# Patient Record
Sex: Male | Born: 1985 | Race: White | Hispanic: No | Marital: Married | Smoking: Never smoker
Health system: Southern US, Community
[De-identification: ages and names within clinical notes are randomized; demographics above are authoritative.]

## PROBLEM LIST (undated history)

## (undated) DIAGNOSIS — N200 Calculus of kidney: Secondary | ICD-10-CM

## (undated) DIAGNOSIS — R51 Headache: Secondary | ICD-10-CM

## (undated) DIAGNOSIS — R519 Headache, unspecified: Secondary | ICD-10-CM

## (undated) DIAGNOSIS — Z87442 Personal history of urinary calculi: Secondary | ICD-10-CM

## (undated) HISTORY — PX: WISDOM TOOTH EXTRACTION: SHX21

## (undated) HISTORY — DX: Calculus of kidney: N20.0

---

## 2010-08-04 ENCOUNTER — Emergency Department (HOSPITAL_COMMUNITY): Payer: 59

## 2010-08-04 ENCOUNTER — Emergency Department (HOSPITAL_COMMUNITY)
Admission: EM | Admit: 2010-08-04 | Discharge: 2010-08-04 | Disposition: A | Discharge status: Home / Self Care | Payer: 59 | Attending: Emergency Medicine | Admitting: Emergency Medicine

## 2010-08-04 DIAGNOSIS — N200 Calculus of kidney: Secondary | ICD-10-CM | POA: Insufficient documentation

## 2010-08-04 DIAGNOSIS — R1011 Right upper quadrant pain: Secondary | ICD-10-CM | POA: Insufficient documentation

## 2010-08-04 DIAGNOSIS — R10819 Abdominal tenderness, unspecified site: Secondary | ICD-10-CM | POA: Insufficient documentation

## 2010-08-04 LAB — POCT I-STAT, CHEM 8
Creatinine, Ser: 1 mg/dL (ref 0.4–1.5)
Glucose, Bld: 97 mg/dL (ref 70–99)
Hemoglobin: 17 g/dL (ref 13.0–17.0)
TCO2: 24 mmol/L (ref 0–100)

## 2010-08-04 LAB — URINALYSIS, ROUTINE W REFLEX MICROSCOPIC
Bilirubin Urine: NEGATIVE
Ketones, ur: 15 mg/dL — AB
Protein, ur: 100 mg/dL — AB
Urobilinogen, UA: 0.2 mg/dL (ref 0.0–1.0)

## 2015-06-12 ENCOUNTER — Encounter: Payer: Self-pay | Admitting: Unknown Physician Specialty

## 2015-06-12 ENCOUNTER — Ambulatory Visit (INDEPENDENT_AMBULATORY_CARE_PROVIDER_SITE_OTHER): Payer: Commercial Managed Care - PPO | Admitting: Unknown Physician Specialty

## 2015-06-12 VITALS — BP 124/64 | HR 70 | Temp 98.4°F | Ht 65.8 in | Wt 164.2 lb

## 2015-06-12 DIAGNOSIS — Z0000 Encounter for general adult medical examination without abnormal findings: Secondary | ICD-10-CM | POA: Diagnosis not present

## 2015-06-12 NOTE — Progress Notes (Signed)
   BP 124/64 mmHg  Pulse 70  Temp(Src) 98.4 F (36.9 C)  Ht 5' 5.8" (1.671 m)  Wt 164 lb 3.2 oz (74.481 kg)  BMI 26.67 kg/m2  SpO2 97%   Subjective:    Patient ID: Travis Wise, male    DOB: [birth year], 30 y.o.   MRN: [id number]  HPI: Travis Wise is a 30 y.o. male  Chief Complaint  Patient presents with  . Establish Care    Relevant past medical, surgical, family and social history reviewed and updated as indicated. Interim medical history since our last visit reviewed. Allergies and medications reviewed and updated.  Past Medical History  Diagnosis Date  . Kidney stones     History reviewed. No pertinent past surgical history.  History reviewed. No pertinent family history.   Review of Systems  Constitutional: Negative.   HENT: Negative.   Eyes: Negative.   Respiratory: Negative.   Cardiovascular: Negative.   Gastrointestinal: Negative.   Endocrine: Negative.   Genitourinary: Negative.   Skin: Negative.   Allergic/Immunologic: Negative.   Neurological: Negative.   Hematological: Negative.   Psychiatric/Behavioral: Negative.     Per HPI unless specifically indicated above     Objective:    BP 124/64 mmHg  Pulse 70  Temp(Src) 98.4 F (36.9 C)  Ht 5' 5.8" (1.671 m)  Wt 164 lb 3.2 oz (74.481 kg)  BMI 26.67 kg/m2  SpO2 97%  Wt Readings from Last 3 Encounters:  06/12/15 164 lb 3.2 oz (74.481 kg)    Physical Exam  Constitutional: He is oriented to person, place, and time. He appears well-developed and well-nourished.  HENT:  Head: Normocephalic.  Right Ear: Tympanic membrane, external ear and ear canal normal.  Left Ear: Tympanic membrane, external ear and ear canal normal.  Mouth/Throat: Uvula is midline, oropharynx is clear and moist and mucous membranes are normal.  Eyes: Pupils are equal, round, and reactive to light.  Cardiovascular: Normal rate, regular rhythm and normal heart sounds.  Exam reveals no gallop and no friction rub.   No  murmur heard. Pulmonary/Chest: Effort normal and breath sounds normal. No respiratory distress.  Abdominal: Soft. Bowel sounds are normal. He exhibits no distension. There is no tenderness.  Musculoskeletal: Normal range of motion.  Neurological: He is alert and oriented to person, place, and time. He has normal reflexes.  Skin: Skin is warm and dry.  Psychiatric: He has a normal mood and affect. His behavior is normal. Judgment and thought content normal.     Assessment & Plan:   Problem List Items Addressed This Visit    None    Visit Diagnoses    Annual physical exam    -  Primary    Relevant Orders    HIV antibody    CBC with Differential/Platelet    Comprehensive metabolic panel    Lipid Panel w/o Chol/HDL Ratio    TSH        Follow up plan: Return in about 1 year (around 06/11/2016).

## 2015-06-13 ENCOUNTER — Encounter: Payer: Self-pay | Admitting: Unknown Physician Specialty

## 2015-06-13 LAB — COMPREHENSIVE METABOLIC PANEL
ALBUMIN: 4.6 g/dL (ref 3.5–5.5)
ALT: 34 IU/L (ref 0–44)
AST: 24 IU/L (ref 0–40)
Albumin/Globulin Ratio: 1.6 (ref 1.1–2.5)
Alkaline Phosphatase: 116 IU/L (ref 39–117)
BUN / CREAT RATIO: 14 (ref 8–19)
BUN: 13 mg/dL (ref 6–20)
Bilirubin Total: 0.3 mg/dL (ref 0.0–1.2)
CALCIUM: 9.6 mg/dL (ref 8.7–10.2)
CO2: 22 mmol/L (ref 18–29)
CREATININE: 0.92 mg/dL (ref 0.76–1.27)
Chloride: 102 mmol/L (ref 96–106)
GFR calc Af Amer: 129 mL/min/{1.73_m2} (ref >59)
GFR, EST NON AFRICAN AMERICAN: 112 mL/min/{1.73_m2} (ref >59)
GLOBULIN, TOTAL: 2.8 g/dL (ref 1.5–4.5)
Glucose: 96 mg/dL (ref 65–99)
Potassium: 4.9 mmol/L (ref 3.5–5.2)
SODIUM: 141 mmol/L (ref 134–144)
Total Protein: 7.4 g/dL (ref 6.0–8.5)

## 2015-06-13 LAB — CBC WITH DIFFERENTIAL/PLATELET
BASOS: 1 %
Basophils Absolute: 0.1 10*3/uL (ref 0.0–0.2)
EOS (ABSOLUTE): 0.1 10*3/uL (ref 0.0–0.4)
EOS: 2 %
HEMATOCRIT: 46.5 % (ref 37.5–51.0)
HEMOGLOBIN: 15.9 g/dL (ref 12.6–17.7)
Immature Grans (Abs): 0 10*3/uL (ref 0.0–0.1)
Immature Granulocytes: 0 %
LYMPHS ABS: 2.5 10*3/uL (ref 0.7–3.1)
Lymphs: 37 %
MCH: 29 pg (ref 26.6–33.0)
MCHC: 34.2 g/dL (ref 31.5–35.7)
MCV: 85 fL (ref 79–97)
MONOCYTES: 10 %
Monocytes Absolute: 0.7 10*3/uL (ref 0.1–0.9)
Neutrophils Absolute: 3.4 10*3/uL (ref 1.4–7.0)
Neutrophils: 50 %
Platelets: 345 10*3/uL (ref 150–379)
RBC: 5.48 x10E6/uL (ref 4.14–5.80)
RDW: 13 % (ref 12.3–15.4)
WBC: 6.7 10*3/uL (ref 3.4–10.8)

## 2015-06-13 LAB — LIPID PANEL W/O CHOL/HDL RATIO
Cholesterol, Total: 189 mg/dL (ref 100–199)
HDL: 40 mg/dL (ref >39)
LDL CALC: 117 mg/dL — AB (ref 0–99)
Triglycerides: 159 mg/dL — ABNORMAL HIGH (ref 0–149)
VLDL Cholesterol Cal: 32 mg/dL (ref 5–40)

## 2015-06-13 LAB — TSH: TSH: 2.11 u[IU]/mL (ref 0.450–4.500)

## 2015-06-13 LAB — HIV ANTIBODY (ROUTINE TESTING W REFLEX): HIV Screen 4th Generation wRfx: NONREACTIVE

## 2015-06-13 NOTE — Progress Notes (Signed)
Quick Note:  Normal labs. Patient notified by letter. ______ 

## 2016-03-21 ENCOUNTER — Ambulatory Visit (INDEPENDENT_AMBULATORY_CARE_PROVIDER_SITE_OTHER): Payer: Commercial Managed Care - PPO | Admitting: Family Medicine

## 2016-03-21 ENCOUNTER — Encounter: Payer: Self-pay | Admitting: Family Medicine

## 2016-03-21 VITALS — BP 129/82 | HR 82 | Temp 98.0°F | Wt 169.0 lb

## 2016-03-21 DIAGNOSIS — J101 Influenza due to other identified influenza virus with other respiratory manifestations: Secondary | ICD-10-CM

## 2016-03-21 MED ORDER — OSELTAMIVIR PHOSPHATE 75 MG PO CAPS: 75.0000 mg | ORAL_CAPSULE | Freq: Two times a day (BID) | ORAL | 0 refills | Status: DC

## 2016-03-21 MED ORDER — BENZONATATE 100 MG PO CAPS: 200.0000 mg | ORAL_CAPSULE | Freq: Three times a day (TID) | ORAL | 0 refills | Status: DC | PRN

## 2016-03-21 MED ORDER — HYDROCOD POLST-CPM POLST ER 10-8 MG/5ML PO SUER: 5.0000 mL | Freq: Two times a day (BID) | ORAL | 0 refills | Status: DC | PRN

## 2016-03-21 NOTE — Progress Notes (Signed)
   BP 129/82   Pulse 82   Temp 98 F (36.7 C)   Wt 169 lb (76.7 kg)   SpO2 98%   BMI 27.44 kg/m    Subjective:    Patient ID: Travis Wise, male    DOB: 1986-04-05, 30 y.o.   MRN: [id number]  HPI: Travis Wise is a 30 y.o. male  Chief Complaint  Patient presents with  . URI    x 1 day, head and chest congestion, cough, sore throat. No fever. No body aches.    Patient presents with 1 day of congestion, productive cough, sore throat. Denies fever, chills, aches, N/V/D, SOB. Wife started with symptoms about 12 hours earlier. States he wouldn't have come in, but has a 326 week old daughter at home and does not want to give anything to her. Not currently taking anything OTC other than cough drops.   Relevant past medical, surgical, family and social history reviewed and updated as indicated. Interim medical history since our last visit reviewed. Allergies and medications reviewed and updated.  Review of Systems  Constitutional: Negative.   HENT: Positive for congestion and sore throat.   Eyes: Negative.   Respiratory: Positive for cough.   Cardiovascular: Negative.   Gastrointestinal: Negative.   Genitourinary: Negative.   Musculoskeletal: Negative.   Neurological: Negative.   Psychiatric/Behavioral: Negative.    Per HPI unless specifically indicated above     Objective:    BP 129/82   Pulse 82   Temp 98 F (36.7 C)   Wt 169 lb (76.7 kg)   SpO2 98%   BMI 27.44 kg/m   Wt Readings from Last 3 Encounters:  03/21/16 169 lb (76.7 kg)  06/12/15 164 lb 3.2 oz (74.5 kg)    Physical Exam  Constitutional: He is oriented to person, place, and time. He appears well-developed and well-nourished. No distress.  HENT:  Head: Atraumatic.  Right Ear: External ear normal.  Left Ear: External ear normal.  Nose: Nose normal.  Oropharynx erythematous  Eyes: Conjunctivae are normal. Pupils are equal, round, and reactive to light. No scleral icterus.  Neck: Normal range of motion.  Neck supple.  Cardiovascular: Normal rate and normal heart sounds.   Pulmonary/Chest: Effort normal and breath sounds normal. No respiratory distress. He has no wheezes.  Musculoskeletal: Normal range of motion.  Lymphadenopathy:    He has no cervical adenopathy.  Neurological: He is alert and oriented to person, place, and time.  Skin: Skin is warm and dry.  Psychiatric: He has a normal mood and affect. His behavior is normal.  Nursing note and vitals reviewed.     Assessment & Plan:   Problem List Items Addressed This Visit    None    Visit Diagnoses    Influenza B    -  Primary   Given wife's positive flu test and 6 week infant at home, will send tamiflu. Will also send tessalon and tussionex for supportive care   Relevant Medications   oseltamivir (TAMIFLU) 75 MG capsule    Discussed at length precautions, especially sedation, with the tussionex given having a newborn at home. Patient voiced understanding.    Follow up plan: Return if symptoms worsen or fail to improve.

## 2016-03-21 NOTE — Patient Instructions (Signed)
Follow up as needed

## 2016-07-16 ENCOUNTER — Ambulatory Visit (INDEPENDENT_AMBULATORY_CARE_PROVIDER_SITE_OTHER): Payer: Commercial Managed Care - PPO | Admitting: Family Medicine

## 2016-07-16 ENCOUNTER — Encounter: Payer: Self-pay | Admitting: Family Medicine

## 2016-07-16 VITALS — BP 138/81 | HR 85 | Temp 98.0°F | Ht 65.75 in | Wt 166.5 lb

## 2016-07-16 DIAGNOSIS — Z91018 Allergy to other foods: Secondary | ICD-10-CM

## 2016-07-16 DIAGNOSIS — G43909 Migraine, unspecified, not intractable, without status migrainosus: Secondary | ICD-10-CM | POA: Insufficient documentation

## 2016-07-16 DIAGNOSIS — Z13220 Encounter for screening for lipoid disorders: Secondary | ICD-10-CM | POA: Diagnosis not present

## 2016-07-16 DIAGNOSIS — R0602 Shortness of breath: Secondary | ICD-10-CM

## 2016-07-16 DIAGNOSIS — J45909 Unspecified asthma, uncomplicated: Secondary | ICD-10-CM | POA: Insufficient documentation

## 2016-07-16 DIAGNOSIS — J4520 Mild intermittent asthma, uncomplicated: Secondary | ICD-10-CM | POA: Diagnosis not present

## 2016-07-16 DIAGNOSIS — Z1329 Encounter for screening for other suspected endocrine disorder: Secondary | ICD-10-CM

## 2016-07-16 DIAGNOSIS — Z0000 Encounter for general adult medical examination without abnormal findings: Secondary | ICD-10-CM | POA: Diagnosis not present

## 2016-07-16 DIAGNOSIS — G43009 Migraine without aura, not intractable, without status migrainosus: Secondary | ICD-10-CM

## 2016-07-16 LAB — MICROSCOPIC EXAMINATION
Bacteria, UA: NONE SEEN
RBC MICROSCOPIC, UA: NONE SEEN /HPF (ref 0–?)
WBC UA: NONE SEEN /HPF (ref 0–?)

## 2016-07-16 LAB — UA/M W/RFLX CULTURE, ROUTINE
Bilirubin, UA: NEGATIVE
GLUCOSE, UA: NEGATIVE
KETONES UA: NEGATIVE
Leukocytes, UA: NEGATIVE
NITRITE UA: NEGATIVE
Protein, UA: NEGATIVE
RBC, UA: NEGATIVE
SPEC GRAV UA: 1.025 (ref 1.005–1.030)
UUROB: 0.2 mg/dL (ref 0.2–1.0)
pH, UA: 7 (ref 5.0–7.5)

## 2016-07-16 MED ORDER — SUMATRIPTAN SUCCINATE 50 MG PO TABS: 50.0000 mg | ORAL_TABLET | ORAL | 3 refills | Status: DC | PRN

## 2016-07-16 MED ORDER — ALBUTEROL SULFATE HFA 108 (90 BASE) MCG/ACT IN AERS: 2.0000 | INHALATION_SPRAY | Freq: Four times a day (QID) | RESPIRATORY_TRACT | 11 refills | Status: DC | PRN

## 2016-07-16 MED ORDER — CETIRIZINE HCL 10 MG PO TABS: 10.0000 mg | ORAL_TABLET | Freq: Every day | ORAL | 11 refills | Status: DC

## 2016-07-16 NOTE — Assessment & Plan Note (Signed)
Spirometry today showing some obstructive disease. Discussed proper use of albuterol inhaler and importance of good allergy regimen given consistent outdoor work. Will start zyrtec daily. Follow up if no improvement.

## 2016-07-16 NOTE — Progress Notes (Signed)
BP 138/81 (BP Location: Right Arm, Patient Position: Sitting, Cuff Size: Normal)   Pulse 85   Temp 98 F (36.7 C)   Ht 5' 5.75" (1.67 m)   Wt 166 lb 8 oz (75.5 kg)   SpO2 98%   BMI 27.08 kg/m    Subjective:    Patient ID: Travis Wise, male    DOB: March 17, 1986, 31 y.o.   MRN: [id number]  HPI: Travis Wise is a 31 y.o. male presenting on 07/16/2016 for comprehensive medical examination. Current medical complaints include:listed below  Has alpha gal allergy s/p tick bite about 3 years ago. Can not tolerate red meat or dairy, but now able to tolerate pork ok. Hives, vomiting, diarrhea if consuming any dairy or red meat or anything cooked with it. Would like to be rechecked today since his pork tolerance has improved and he has not tried red meat in years so does not know if that has improved.   Long hx of migraines, but has noticed them worsening in severity and frequency the past 3-4 months. Taking excedrin migraine with fair relief - now having several migraines weekly. Wears sunglasses every day while working outside. States was able to go to a dark room and wait them out when working indoors but now since switching to outdoor work they have flared up.   SOB since starting new job working outside all day every day the past 3-4 months. Mother has asthma, wondering if he may have some too. Never noticed an issue until recently. Denies wheezing or coughing spells, but feels some chest tightness and SOB with exertion. Has not tried taking anything for sxs. No known seasonal allergies.   He currently lives with: wife and infant Interim Problems from his last visit: no  Depression Screen done today and results listed below:  Depression screen [facility] 2/9 07/16/2016  Decreased Interest 0  Down, Depressed, Hopeless 0  PHQ - 2 Score 0  Altered sleeping 0  Tired, decreased energy 0  Change in appetite 0  Feeling bad or failure about yourself  0  Trouble concentrating 0  Moving slowly or  fidgety/restless 0  Suicidal thoughts 0  PHQ-9 Score 0    The patient does not have a history of falls. I did not complete a risk assessment for falls. A plan of care for falls was not documented.   Past Medical History:  Past Medical History:  Diagnosis Date  . Kidney stones     Surgical History:  No past surgical history on file.  Medications:  No current outpatient prescriptions on file prior to visit.   No current facility-administered medications on file prior to visit.     Allergies:  Allergies  Allergen Reactions  . Other Hives    Red Meat    Social History:  Social History   Social History  . Marital status: Married    Spouse name: N/A  . Number of children: N/A  . Years of education: N/A   Occupational History  . Not on file.   Social History Main Topics  . Smoking status: Never Smoker  . Smokeless tobacco: Former Neurosurgeon  . Alcohol use 0.0 oz/week     Comment: on occasion  . Drug use: No  . Sexual activity: Yes   Other Topics Concern  . Not on file   Social History Narrative  . No narrative on file   History  Smoking Status  . Never Smoker  Smokeless Tobacco  . Former Neurosurgeon  History  Alcohol Use  . 0.0 oz/week    Comment: on occasion    Family History:  No family history on file.  Past medical history, surgical history, medications, allergies, family history and social history reviewed with patient today and changes made to appropriate areas of the chart.   Review of Systems - General ROS: negative Psychological ROS: negative Ophthalmic ROS: negative ENT ROS: negative Respiratory ROS: positive for - shortness of breath Cardiovascular ROS: no chest pain or dyspnea on exertion Gastrointestinal ROS: no abdominal pain, change in bowel habits, or black or bloody stools Genito-Urinary ROS: no dysuria, trouble voiding, or hematuria Musculoskeletal ROS: negative Neurological ROS: no TIA or stroke symptoms positive for -  headaches Dermatological ROS: negative All other ROS negative except what is listed above and in the HPI.      Objective:    BP 138/81 (BP Location: Right Arm, Patient Position: Sitting, Cuff Size: Normal)   Pulse 85   Temp 98 F (36.7 C)   Ht 5' 5.75" (1.67 m)   Wt 166 lb 8 oz (75.5 kg)   SpO2 98%   BMI 27.08 kg/m   Wt Readings from Last 3 Encounters:  07/16/16 166 lb 8 oz (75.5 kg)  03/21/16 169 lb (76.7 kg)  06/12/15 164 lb 3.2 oz (74.5 kg)    Physical Exam  Constitutional: He is oriented to person, place, and time. He appears well-developed and well-nourished. No distress.  HENT:  Head: Atraumatic.  Right Ear: External ear normal.  Left Ear: External ear normal.  Nose: Nose normal.  Mouth/Throat: Oropharynx is clear and moist. No oropharyngeal exudate.  Eyes: Conjunctivae are normal. Pupils are equal, round, and reactive to light. No scleral icterus.  Neck: Normal range of motion. Neck supple. No thyromegaly present.  Cardiovascular: Normal rate and normal heart sounds.   Pulmonary/Chest: Effort normal and breath sounds normal. No respiratory distress.  Abdominal: Soft. Bowel sounds are normal. He exhibits no distension and no mass. There is no tenderness.  Musculoskeletal: Normal range of motion.  Lymphadenopathy:    He has no cervical adenopathy.  Neurological: He is alert and oriented to person, place, and time. No cranial nerve deficit. He exhibits normal muscle tone. Coordination normal.  Skin: Skin is warm and dry. No rash noted.  Psychiatric: He has a normal mood and affect. His behavior is normal.  Nursing note and vitals reviewed.     Assessment & Plan:   Problem List Items Addressed This Visit      Cardiovascular and Mediastinum   Migraine    Continue excedrin and/or tylenol OTC prn, start taking imitrex as soon as starting to feel migraine come on. Can repeat once in 2 hours if persisting. Keep headache diary to see if triggers can be pinpointed.  Continue wearing sunglasses and hats when working outside to help avoid harsh sunlight.       Relevant Medications   SUMAtriptan (IMITREX) 50 MG tablet     Respiratory   Asthma    Spirometry today showing some obstructive disease. Discussed proper use of albuterol inhaler and importance of good allergy regimen given consistent outdoor work. Will start zyrtec daily. Follow up if no improvement.       Relevant Medications   albuterol (PROVENTIL HFA;VENTOLIN HFA) 108 (90 Base) MCG/ACT inhaler    Other Visit Diagnoses    Annual physical exam    -  Primary   Not fasting for labs today. Await results   Relevant Orders  CBC with Differential/Platelet (Completed)   Comprehensive metabolic panel (Completed)   Lipid panel (Completed)   TSH (Completed)   UA/M w/rflx Culture, Routine (Completed)   Screening cholesterol level       Relevant Orders   Lipid panel (Completed)   Thyroid disorder screen       Relevant Orders   TSH (Completed)   Allergy to meat       Await Alpha gal levels. Continue avoidance of red meat and dairy in meantime   Relevant Orders   Alpha Gal IgE (Completed)   SOB (shortness of breath)       PFTs today pre and post nebulizer indicate some mild asthma. See treatment plan under asthma   Relevant Orders   Spirometry with Graph (Completed)       Discussed aspirin prophylaxis for myocardial infarction prevention and decision was it was not indicated  LABORATORY TESTING:  Health maintenance labs ordered today as discussed above.   The natural history of prostate cancer and ongoing controversy regarding screening and potential treatment outcomes of prostate cancer has been discussed with the patient. The meaning of a false positive PSA and a false negative PSA has been discussed. He indicates understanding of the limitations of this screening test and wishes not to proceed with screening PSA testing.   IMMUNIZATIONS:   - Tdap: Tetanus vaccination status reviewed:  last tetanus booster within 10 years. - Influenza: Up to date  SCREENING: -Spirometry: Ordered today   PATIENT COUNSELING:    Sexuality: Discussed sexually transmitted diseases, partner selection, use of condoms, avoidance of unintended pregnancy  and contraceptive alternatives.   Advised to avoid cigarette smoking.  I discussed with the patient that most people either abstain from alcohol or drink within safe limits (<=14/week and <=4 drinks/occasion for males, <=7/weeks and <= 3 drinks/occasion for females) and that the risk for alcohol disorders and other health effects rises proportionally with the number of drinks per week and how often a drinker exceeds daily limits.  Discussed cessation/primary prevention of drug use and availability of treatment for abuse.   Diet: Encouraged to adjust caloric intake to maintain  or achieve ideal body weight, to reduce intake of dietary saturated fat and total fat, to limit sodium intake by avoiding high sodium foods and not adding table salt, and to maintain adequate dietary potassium and calcium preferably from fresh fruits, vegetables, and low-fat dairy products.    stressed the importance of regular exercise  Injury prevention: Discussed safety belts, safety helmets, smoke detector, smoking near bedding or upholstery.   Dental health: Discussed importance of regular tooth brushing, flossing, and dental visits.   Follow up plan: NEXT PREVENTATIVE PHYSICAL DUE IN 1 YEAR. Return in about 1 year (around 07/16/2017) for Annual Exam.

## 2016-07-16 NOTE — Assessment & Plan Note (Signed)
Continue excedrin and/or tylenol OTC prn, start taking imitrex as soon as starting to feel migraine come on. Can repeat once in 2 hours if persisting. Keep headache diary to see if triggers can be pinpointed. Continue wearing sunglasses and hats when working outside to help avoid harsh sunlight.

## 2016-07-19 ENCOUNTER — Telehealth: Payer: Self-pay | Admitting: Family Medicine

## 2016-07-19 LAB — COMPREHENSIVE METABOLIC PANEL
ALBUMIN: 4.5 g/dL (ref 3.5–5.5)
ALT: 32 IU/L (ref 0–44)
AST: 19 IU/L (ref 0–40)
Albumin/Globulin Ratio: 2.1 (ref 1.2–2.2)
Alkaline Phosphatase: 99 IU/L (ref 39–117)
BUN/Creatinine Ratio: 17 (ref 9–20)
BUN: 16 mg/dL (ref 6–20)
Bilirubin Total: <0.2 mg/dL (ref 0.0–1.2)
CALCIUM: 9.3 mg/dL (ref 8.7–10.2)
CO2: 21 mmol/L (ref 18–29)
Chloride: 104 mmol/L (ref 96–106)
Creatinine, Ser: 0.94 mg/dL (ref 0.76–1.27)
GFR calc Af Amer: 124 mL/min/{1.73_m2} (ref >59)
GFR, EST NON AFRICAN AMERICAN: 108 mL/min/{1.73_m2} (ref >59)
GLUCOSE: 100 mg/dL — AB (ref 65–99)
Globulin, Total: 2.1 g/dL (ref 1.5–4.5)
Potassium: 4.2 mmol/L (ref 3.5–5.2)
Sodium: 143 mmol/L (ref 134–144)
Total Protein: 6.6 g/dL (ref 6.0–8.5)

## 2016-07-19 LAB — CBC WITH DIFFERENTIAL/PLATELET
BASOS ABS: 0.1 10*3/uL (ref 0.0–0.2)
Basos: 1 %
EOS (ABSOLUTE): 0.1 10*3/uL (ref 0.0–0.4)
EOS: 1 %
HEMOGLOBIN: 15.2 g/dL (ref 13.0–17.7)
Hematocrit: 44.9 % (ref 37.5–51.0)
IMMATURE GRANULOCYTES: 1 %
Immature Grans (Abs): 0.1 10*3/uL (ref 0.0–0.1)
LYMPHS: 43 %
Lymphocytes Absolute: 3.2 10*3/uL — ABNORMAL HIGH (ref 0.7–3.1)
MCH: 29.2 pg (ref 26.6–33.0)
MCHC: 33.9 g/dL (ref 31.5–35.7)
MCV: 86 fL (ref 79–97)
MONOCYTES: 10 %
Monocytes Absolute: 0.7 10*3/uL (ref 0.1–0.9)
NEUTROS PCT: 44 %
Neutrophils Absolute: 3.4 10*3/uL (ref 1.4–7.0)
Platelets: 287 10*3/uL (ref 150–379)
RBC: 5.2 x10E6/uL (ref 4.14–5.80)
RDW: 13.8 % (ref 12.3–15.4)
WBC: 7.5 10*3/uL (ref 3.4–10.8)

## 2016-07-19 LAB — LIPID PANEL
CHOLESTEROL TOTAL: 185 mg/dL (ref 100–199)
Chol/HDL Ratio: 6 ratio — ABNORMAL HIGH (ref 0.0–5.0)
HDL: 31 mg/dL — ABNORMAL LOW (ref >39)
LDL CALC: 97 mg/dL (ref 0–99)
TRIGLYCERIDES: 284 mg/dL — AB (ref 0–149)
VLDL CHOLESTEROL CAL: 57 mg/dL — AB (ref 5–40)

## 2016-07-19 LAB — TSH: TSH: 2.24 u[IU]/mL (ref 0.450–4.500)

## 2016-07-19 LAB — ALPHA GAL IGE: Alpha Gal IgE*: <0.1 kU/L (ref <0.35)

## 2016-07-19 NOTE — Telephone Encounter (Signed)
Please call pt and let him know that all of his labs came back normal, including his alpha gal testing. If he would like to get a referral to the allergy specialist, I'm happy to do so to figure out why he is allergic to meat and dairy

## 2016-07-19 NOTE — Telephone Encounter (Signed)
Patient notified of the results. He is going to try eating meat and see how he does. If he has symptoms still, he will let us know and will order the referral.

## 2016-08-20 ENCOUNTER — Ambulatory Visit (INDEPENDENT_AMBULATORY_CARE_PROVIDER_SITE_OTHER): Payer: Commercial Managed Care - PPO | Admitting: Family Medicine

## 2016-08-20 ENCOUNTER — Telehealth: Payer: Self-pay | Admitting: Family Medicine

## 2016-08-20 ENCOUNTER — Ambulatory Visit
Admission: RE | Admit: 2016-08-20 | Discharge: 2016-08-20 | Disposition: A | Discharge status: Home / Self Care | Payer: Commercial Managed Care - PPO | Source: Ambulatory Visit | Attending: Family Medicine | Admitting: Family Medicine

## 2016-08-20 ENCOUNTER — Encounter: Payer: Self-pay | Admitting: Family Medicine

## 2016-08-20 VITALS — BP 120/81 | HR 62 | Temp 98.5°F | Wt 163.0 lb

## 2016-08-20 DIAGNOSIS — G43009 Migraine without aura, not intractable, without status migrainosus: Secondary | ICD-10-CM | POA: Diagnosis not present

## 2016-08-20 DIAGNOSIS — M25511 Pain in right shoulder: Secondary | ICD-10-CM | POA: Insufficient documentation

## 2016-08-20 MED ORDER — CYCLOBENZAPRINE HCL 10 MG PO TABS: 10.0000 mg | ORAL_TABLET | Freq: Three times a day (TID) | ORAL | 0 refills | Status: DC | PRN

## 2016-08-20 MED ORDER — TOPIRAMATE 25 MG PO TABS: ORAL_TABLET | ORAL | 1 refills | Status: DC

## 2016-08-20 MED ORDER — OXYCODONE-ACETAMINOPHEN 5-325 MG PO TABS: 1.0000 | ORAL_TABLET | Freq: Three times a day (TID) | ORAL | 0 refills | Status: DC | PRN

## 2016-08-20 MED ORDER — PREDNISONE 50 MG PO TABS: 50.0000 mg | ORAL_TABLET | Freq: Every day | ORAL | 0 refills | Status: DC

## 2016-08-20 NOTE — Patient Instructions (Signed)
Follow up as needed

## 2016-08-20 NOTE — Telephone Encounter (Signed)
Patient notified of results.

## 2016-08-20 NOTE — Telephone Encounter (Signed)
Please call pt and let him know his x-ray was normal, as expected. Continue as planned

## 2016-08-20 NOTE — Progress Notes (Signed)
BP 120/81   Pulse 62   Temp 98.5 F (36.9 C)   Wt 163 lb (73.9 kg)   SpO2 100%   BMI 26.51 kg/m    Subjective:    Patient ID: Travis Wise, male    DOB: September 14, 1985, 31 y.o.   MRN: [id number]  HPI: Travis ClicheCharles Pettus is a 31 y.o. male presenting today with right shoulder pain resulting from an injury that occurred 1 week ago while throwing a baseball. There was no audible pop or tear, but there was immediate 7/10 pain. The pain radiates down to his elbow and has not improved over the last week. He endorses weakness but denies numbness and tingling. The pain is currently a 4/10, but becomes more severe throughout the day and with arm movement. Has tried tylenol, ice, heat, biofreeze, TENS unit therapy, and took 2 vicodin one night when he woke up to terrible pain which did not help. Has injured this shoulder 2 times before in this manner but says it improved in a matter of days, was not seen by ortho.   Pt also wanting to discuss some issues he had when taking the imitrex given last time for migraine abortion. Both times he tried taking it his arms went completely numb so he has d/c'd the medication. Wanting to know what else can be taken for his migraines.   Chief Complaint  Patient presents with  . Shoulder Pain    right shoulder, injured approx 1 week ago playing softball. Most of the pain is in his shoulder, but some pain goes down to his elbow. Pain is getting worse. Nothing brings relief.    Past Medical History:  Diagnosis Date  . Kidney stones    Social History   Social History  . Marital status: Married    Spouse name: N/A  . Number of children: N/A  . Years of education: N/A   Occupational History  . Not on file.   Social History Main Topics  . Smoking status: Never Smoker  . Smokeless tobacco: Former NeurosurgeonUser  . Alcohol use 0.0 oz/week     Comment: on occasion  . Drug use: No  . Sexual activity: Yes   Other Topics Concern  . Not on file   Social History Narrative   . No narrative on file   Relevant past medical, surgical, family and social history reviewed and updated as indicated. Interim medical history since our last visit reviewed. Allergies and medications reviewed and updated.  Review of Systems  HENT: Negative.   Eyes: Negative.   Respiratory: Negative.   Cardiovascular: Negative.   Gastrointestinal: Negative.   Endocrine: Negative.   Genitourinary: Negative.   Musculoskeletal: Positive for arthralgias (shoulder pain ). Negative for joint swelling.  Skin: Negative.   Allergic/Immunologic: Negative.   Neurological: Positive for weakness, numbness and headaches.  Hematological: Negative.   Psychiatric/Behavioral: Negative.     Per HPI unless specifically indicated above     Objective:    BP 120/81   Pulse 62   Temp 98.5 F (36.9 C)   Wt 163 lb (73.9 kg)   SpO2 100%   BMI 26.51 kg/m   Wt Readings from Last 3 Encounters:  08/20/16 163 lb (73.9 kg)  07/16/16 166 lb 8 oz (75.5 kg)  03/21/16 169 lb (76.7 kg)    Physical Exam  Constitutional: He is oriented to person, place, and time. He appears well-developed and well-nourished. No distress.  HENT:  Head: Normocephalic and atraumatic.  Eyes: Conjunctivae  are normal. Right eye exhibits no discharge. Left eye exhibits no discharge. No scleral icterus.  Neck: Normal range of motion. Neck supple.  Cardiovascular: Normal rate.   Pulmonary/Chest: Effort normal. No respiratory distress.  Musculoskeletal: He exhibits tenderness. He exhibits no edema or deformity.  Generalized TTP over right shoulder, no swelling, mild bruising at upper border of the scapula; Decreased ROM through right shoulder with positive arm raise, arm able to be raised passively with moderate pain; 4/5 grip strength on right side Full ROM and 5/5 strength of right elbow; Left shoulder and elbow are full ROM and 5/5   Neurological: He is alert and oriented to person, place, and time. Coordination normal.  Skin:  Skin is warm and dry. He is not diaphoretic. No erythema.  Psychiatric: He has a normal mood and affect.      Assessment & Plan:   Problem List Items Addressed This Visit      Cardiovascular and Mediastinum   Migraine    D/c imitrex, will try topamax 25 mg QD x 1 week then BID. Risks and benefits discussed. F/u in 6 weeks for recheck      Relevant Medications   oxyCODONE-acetaminophen (ROXICET) 5-325 MG tablet   cyclobenzaprine (FLEXERIL) 10 MG tablet   topiramate (TOPAMAX) 25 MG tablet    Other Visit Diagnoses    Acute pain of right shoulder    -  Primary   Continue home remedies, will obtain x-ray and place orthopedic referral in case no relief with prednisone, flexeril, and percocet for overnights.    Relevant Orders   AMB referral to orthopedics   DG Shoulder Right (Completed)    Precautions discussed with the medications and sedation risks. No alcohol, driving, etc.    Follow up plan: Return in about 6 weeks (around 10/01/2016) for HAs.

## 2016-08-21 NOTE — Assessment & Plan Note (Addendum)
D/c imitrex, will try topamax 25 mg QD x 1 week then BID. Risks and benefits discussed. F/u in 6 weeks for recheck

## 2016-09-06 ENCOUNTER — Other Ambulatory Visit: Payer: Self-pay | Admitting: Orthopedic Surgery

## 2016-09-16 ENCOUNTER — Other Ambulatory Visit: Payer: Self-pay

## 2016-09-16 MED ORDER — TOPIRAMATE 25 MG PO TABS: 25.0000 mg | ORAL_TABLET | Freq: Two times a day (BID) | ORAL | 1 refills | Status: DC

## 2016-09-25 ENCOUNTER — Telehealth: Payer: Self-pay

## 2016-09-25 NOTE — Telephone Encounter (Signed)
Pharmacy sent refill request for a 90 day supply on Topiramate 25mg . Someone took it out of his med list on 09/23/16.

## 2016-09-25 NOTE — Telephone Encounter (Signed)
Please call pt and verify what he is or isn't taking as it appears someone d/c'd all of his medications

## 2016-09-25 NOTE — Telephone Encounter (Signed)
Left message to call.

## 2016-09-26 ENCOUNTER — Encounter
Admission: RE | Admit: 2016-09-26 | Discharge: 2016-09-26 | Disposition: A | Discharge status: Home / Self Care | Payer: Commercial Managed Care - PPO | Source: Ambulatory Visit | Attending: Orthopedic Surgery | Admitting: Orthopedic Surgery

## 2016-09-26 HISTORY — DX: Headache, unspecified: R51.9

## 2016-09-26 HISTORY — DX: Personal history of urinary calculi: Z87.442

## 2016-09-26 HISTORY — DX: Headache: R51

## 2016-09-26 NOTE — Patient Instructions (Signed)
  Your procedure is scheduled on: 10/03/16 Report to Same Day Surgery 2nd floor medical mall (Medical Mall Entrance-take elevator on left to 2nd floor.  Check in with surgery information desk.) To find out your arrival time please call [phone] between 1PM - 3PM on 10/02/16  Remember: Instructions that are not followed completely may result in serious medical risk, up to and including death, or upon the discretion of your surgeon and anesthesiologist your surgery may need to be rescheduled.    _x___ 1. Do not eat food or drink liquids after midnight. No gum chewing or                              hard candies.     __x__ 2. No Alcohol for 24 hours before or after surgery.   __x__3. No Smoking for 24 prior to surgery.   ____  4. Bring all medications with you on the day of surgery if instructed.    __x__ 5. Notify your doctor if there is any change in your medical condition     (cold, fever, infections).     Do not wear jewelry, make-up, hairpins, clips or nail polish.  Do not wear lotions, powders, or perfumes. You may wear deodorant.  Do not shave 48 hours prior to surgery. Men may shave face and neck.  Do not bring valuables to the hospital.    [facility] is not responsible for any belongings or valuables.               Contacts, dentures or bridgework may not be worn into surgery.  Leave your suitcase in the car. After surgery it may be brought to your room.  For patients admitted to the hospital, discharge time is determined by your                       treatment team.   Patients discharged the day of surgery will not be allowed to drive home.  You will need someone to drive you home and stay with you the night of your procedure.    Please read over the following fact sheets that you were given:   [facility] and or MRSA Information   ____ Take anti-hypertensive (unless it includes a diuretic), cardiac, seizure, asthma,     anti-reflux and psychiatric  medicines. These include:  1.   2.  3.  4.  5.  6.  ____Fleets enema or Magnesium Citrate as directed.   _x___ Use CHG Soap or sage wipes as directed on instruction sheet   ____ Use inhalers on the day of surgery and bring to hospital day of surgery  ____ Stop Metformin and Janumet 2 days prior to surgery.    ____ Take 1/2 of usual insulin dose the night before surgery and none on the morning     surgery.   ____ Follow recommendations from Cardiologist, Pulmonologist or PCP regarding  stopping Aspirin, Coumadin, Pllavix ,Eliquis, Effient, or Pradaxa, and Pletal.  ____Stop Anti-inflammatories such as Advil, Aleve, Ibuprofen, Motrin, Naproxen, Naprosyn, Goodies powders or aspirin products. OK to take Tylenol and  Celebrex.   ____ Stop supplements until after surgery.  But may continue Vitamin D, Vitamin B,       and multivitamin.   ____ Bring C-Pap to the hospital.    

## 2016-09-26 NOTE — Telephone Encounter (Signed)
I spoke with patient, he said that wasn't supposed to be removed from his med list. But he states he does not need a refill on it at this time.

## 2016-09-27 ENCOUNTER — Ambulatory Visit: Payer: Commercial Managed Care - PPO | Admitting: Family Medicine

## 2016-09-27 ENCOUNTER — Inpatient Hospital Stay: Admission: RE | Admit: 2016-09-27 | Payer: Commercial Managed Care - PPO | Source: Ambulatory Visit

## 2016-10-01 ENCOUNTER — Encounter
Admission: RE | Admit: 2016-10-01 | Discharge: 2016-10-01 | Disposition: A | Discharge status: Home / Self Care | Payer: Commercial Managed Care - PPO | Source: Ambulatory Visit | Attending: Orthopedic Surgery | Admitting: Orthopedic Surgery

## 2016-10-01 DIAGNOSIS — R51 Headache: Secondary | ICD-10-CM | POA: Insufficient documentation

## 2016-10-01 DIAGNOSIS — S43431A Superior glenoid labrum lesion of right shoulder, initial encounter: Secondary | ICD-10-CM | POA: Insufficient documentation

## 2016-10-01 DIAGNOSIS — M7550 Bursitis of unspecified shoulder: Secondary | ICD-10-CM | POA: Diagnosis not present

## 2016-10-01 LAB — CBC WITH DIFFERENTIAL/PLATELET
BASOS PCT: 1 %
Basophils Absolute: 0.1 10*3/uL (ref 0–0.1)
EOS ABS: 0.1 10*3/uL (ref 0–0.7)
Eosinophils Relative: 2 %
HCT: 48.5 % (ref 40.0–52.0)
HEMOGLOBIN: 15.9 g/dL (ref 13.0–18.0)
Lymphocytes Relative: 37 %
Lymphs Abs: 3 10*3/uL (ref 1.0–3.6)
MCH: 28.8 pg (ref 26.0–34.0)
MCHC: 32.8 g/dL (ref 32.0–36.0)
MCV: 87.7 fL (ref 80.0–100.0)
MONOS PCT: 9 %
Monocytes Absolute: 0.7 10*3/uL (ref 0.2–1.0)
NEUTROS PCT: 51 %
Neutro Abs: 4.3 10*3/uL (ref 1.4–6.5)
PLATELETS: 302 10*3/uL (ref 150–440)
RBC: 5.54 MIL/uL (ref 4.40–5.90)
RDW: 13.8 % (ref 11.5–14.5)
WBC: 8.2 10*3/uL (ref 3.8–10.6)

## 2016-10-01 LAB — PROTIME-INR
INR: 0.95
PROTHROMBIN TIME: 12.7 s (ref 11.4–15.2)

## 2016-10-01 LAB — APTT: aPTT: 27 seconds (ref 24–36)

## 2016-10-02 MED ORDER — CEFAZOLIN SODIUM-DEXTROSE 2-4 GM/100ML-% IV SOLN
2.0000 g | INTRAVENOUS | Status: AC
  Administered 2016-10-03: 2 g via INTRAVENOUS

## 2016-10-03 ENCOUNTER — Ambulatory Visit: Payer: Commercial Managed Care - PPO | Admitting: Anesthesiology

## 2016-10-03 ENCOUNTER — Encounter: Payer: Self-pay | Admitting: *Deleted

## 2016-10-03 ENCOUNTER — Encounter
Admission: RE | Disposition: A | Discharge status: Home / Self Care | Payer: Self-pay | Source: Ambulatory Visit | Attending: Orthopedic Surgery

## 2016-10-03 ENCOUNTER — Ambulatory Visit
Admission: RE | Admit: 2016-10-03 | Discharge: 2016-10-03 | Disposition: A | Discharge status: Home / Self Care | Payer: Commercial Managed Care - PPO | Source: Ambulatory Visit | Attending: Orthopedic Surgery | Admitting: Orthopedic Surgery

## 2016-10-03 DIAGNOSIS — S43431A Superior glenoid labrum lesion of right shoulder, initial encounter: Secondary | ICD-10-CM | POA: Diagnosis not present

## 2016-10-03 HISTORY — PX: SHOULDER ARTHROSCOPY WITH LABRAL REPAIR: SHX5691

## 2016-10-03 SURGERY — ARTHROSCOPY, SHOULDER, WITH GLENOID LABRUM REPAIR
Anesthesia: General | Laterality: Right | Wound class: Clean

## 2016-10-03 MED ORDER — SUCCINYLCHOLINE CHLORIDE 20 MG/ML IJ SOLN
INTRAMUSCULAR | Status: AC
  Filled 2016-10-03: qty 1

## 2016-10-03 MED ORDER — SODIUM CHLORIDE 0.9 % IV SOLN
INTRAVENOUS | Status: DC | PRN
  Administered 2016-10-03: 10 ug/min via INTRAVENOUS

## 2016-10-03 MED ORDER — LIDOCAINE HCL (PF) 1 % IJ SOLN
INTRAMUSCULAR | Status: AC
  Filled 2016-10-03: qty 30

## 2016-10-03 MED ORDER — LIDOCAINE HCL (PF) 1 % IJ SOLN
INTRAMUSCULAR | Status: DC | PRN
  Administered 2016-10-03: 5 mL

## 2016-10-03 MED ORDER — LIDOCAINE HCL (PF) 2 % IJ SOLN
INTRAMUSCULAR | Status: AC
  Filled 2016-10-03: qty 2

## 2016-10-03 MED ORDER — OXYCODONE HCL 5 MG PO TABS
ORAL_TABLET | ORAL | Status: AC
  Filled 2016-10-03: qty 1

## 2016-10-03 MED ORDER — MIDAZOLAM HCL 2 MG/2ML IJ SOLN
2.0000 mg | Freq: Once | INTRAMUSCULAR | Status: AC
  Administered 2016-10-03: 2 mg via INTRAVENOUS

## 2016-10-03 MED ORDER — PHENYLEPHRINE HCL 10 MG/ML IJ SOLN
INTRAMUSCULAR | Status: AC
  Filled 2016-10-03: qty 1

## 2016-10-03 MED ORDER — ONDANSETRON HCL 4 MG/2ML IJ SOLN
INTRAMUSCULAR | Status: DC | PRN
  Administered 2016-10-03: 4 mg via INTRAVENOUS

## 2016-10-03 MED ORDER — FENTANYL CITRATE (PF) 100 MCG/2ML IJ SOLN
25.0000 ug | INTRAMUSCULAR | Status: DC | PRN
  Administered 2016-10-03 (×2): 25 ug via INTRAVENOUS

## 2016-10-03 MED ORDER — FENTANYL CITRATE (PF) 100 MCG/2ML IJ SOLN
INTRAMUSCULAR | Status: AC
  Filled 2016-10-03: qty 2

## 2016-10-03 MED ORDER — CEFAZOLIN SODIUM-DEXTROSE 2-4 GM/100ML-% IV SOLN
INTRAVENOUS | Status: AC
  Filled 2016-10-03: qty 100

## 2016-10-03 MED ORDER — BUPIVACAINE HCL (PF) 0.25 % IJ SOLN
INTRAMUSCULAR | Status: AC
  Filled 2016-10-03: qty 30

## 2016-10-03 MED ORDER — FAMOTIDINE 20 MG PO TABS
20.0000 mg | ORAL_TABLET | Freq: Once | ORAL | Status: AC
  Administered 2016-10-03: 20 mg via ORAL

## 2016-10-03 MED ORDER — MIDAZOLAM HCL 2 MG/2ML IJ SOLN
INTRAMUSCULAR | Status: AC
  Administered 2016-10-03: 2 mg via INTRAVENOUS
  Filled 2016-10-03: qty 2

## 2016-10-03 MED ORDER — ROPIVACAINE HCL 5 MG/ML IJ SOLN
INTRAMUSCULAR | Status: DC | PRN
  Administered 2016-10-03: 30 mL via PERINEURAL

## 2016-10-03 MED ORDER — LIDOCAINE HCL (PF) 1 % IJ SOLN
INTRAMUSCULAR | Status: DC | PRN
  Administered 2016-10-03: 3 mL

## 2016-10-03 MED ORDER — EPINEPHRINE 30 MG/30ML IJ SOLN
INTRAMUSCULAR | Status: AC
  Filled 2016-10-03: qty 1

## 2016-10-03 MED ORDER — FENTANYL CITRATE (PF) 100 MCG/2ML IJ SOLN
INTRAMUSCULAR | Status: DC | PRN
  Administered 2016-10-03: 50 ug via INTRAVENOUS
  Administered 2016-10-03: 100 ug via INTRAVENOUS
  Administered 2016-10-03: 50 ug via INTRAVENOUS

## 2016-10-03 MED ORDER — PROPOFOL 10 MG/ML IV BOLUS
INTRAVENOUS | Status: AC
  Filled 2016-10-03: qty 20

## 2016-10-03 MED ORDER — ROCURONIUM BROMIDE 100 MG/10ML IV SOLN
INTRAVENOUS | Status: DC | PRN
  Administered 2016-10-03: 5 mg via INTRAVENOUS
  Administered 2016-10-03: 20 mg via INTRAVENOUS
  Administered 2016-10-03: 40 mg via INTRAVENOUS

## 2016-10-03 MED ORDER — FAMOTIDINE 20 MG PO TABS
ORAL_TABLET | ORAL | Status: AC
  Administered 2016-10-03: 20 mg via ORAL
  Filled 2016-10-03: qty 1

## 2016-10-03 MED ORDER — ROCURONIUM BROMIDE 50 MG/5ML IV SOLN
INTRAVENOUS | Status: AC
  Filled 2016-10-03: qty 1

## 2016-10-03 MED ORDER — LACTATED RINGERS IV SOLN
INTRAVENOUS | Status: DC
  Administered 2016-10-03: 07:00:00 via INTRAVENOUS

## 2016-10-03 MED ORDER — CHLORHEXIDINE GLUCONATE CLOTH 2 % EX PADS: 6.0000 | MEDICATED_PAD | Freq: Once | CUTANEOUS | Status: DC

## 2016-10-03 MED ORDER — ROPIVACAINE HCL 5 MG/ML IJ SOLN
INTRAMUSCULAR | Status: AC
  Filled 2016-10-03: qty 30

## 2016-10-03 MED ORDER — ONDANSETRON HCL 4 MG PO TABS: 4.0000 mg | ORAL_TABLET | Freq: Three times a day (TID) | ORAL | 0 refills | Status: DC | PRN

## 2016-10-03 MED ORDER — PHENYLEPHRINE HCL 10 MG/ML IJ SOLN
INTRAMUSCULAR | Status: DC | PRN
  Administered 2016-10-03: 100 ug via INTRAVENOUS

## 2016-10-03 MED ORDER — OXYCODONE HCL 5 MG PO TABS: 5.0000 mg | ORAL_TABLET | ORAL | 0 refills | Status: DC | PRN

## 2016-10-03 MED ORDER — SUGAMMADEX SODIUM 200 MG/2ML IV SOLN
INTRAVENOUS | Status: DC | PRN
  Administered 2016-10-03: 150 mg via INTRAVENOUS

## 2016-10-03 MED ORDER — LIDOCAINE HCL (CARDIAC) 20 MG/ML IV SOLN
INTRAVENOUS | Status: DC | PRN
  Administered 2016-10-03: 50 mg via INTRAVENOUS

## 2016-10-03 MED ORDER — LIDOCAINE HCL (PF) 1 % IJ SOLN
INTRAMUSCULAR | Status: AC
  Filled 2016-10-03: qty 5

## 2016-10-03 MED ORDER — OXYCODONE HCL 5 MG PO TABS
5.0000 mg | ORAL_TABLET | Freq: Once | ORAL | Status: AC | PRN
Start: 2016-10-03 — End: 2016-10-03
  Administered 2016-10-03: 5 mg via ORAL

## 2016-10-03 MED ORDER — SUCCINYLCHOLINE CHLORIDE 20 MG/ML IJ SOLN
INTRAMUSCULAR | Status: DC | PRN
  Administered 2016-10-03: 80 mg via INTRAVENOUS

## 2016-10-03 MED ORDER — MEPERIDINE HCL 50 MG/ML IJ SOLN: 6.2500 mg | INTRAMUSCULAR | Status: DC | PRN

## 2016-10-03 MED ORDER — ONDANSETRON HCL 4 MG/2ML IJ SOLN
INTRAMUSCULAR | Status: AC
  Filled 2016-10-03: qty 2

## 2016-10-03 MED ORDER — OXYCODONE HCL 5 MG/5ML PO SOLN: 5.0000 mg | Freq: Once | ORAL | Status: AC | PRN

## 2016-10-03 MED ORDER — SUGAMMADEX SODIUM 200 MG/2ML IV SOLN
INTRAVENOUS | Status: AC
  Filled 2016-10-03: qty 2

## 2016-10-03 MED ORDER — PROPOFOL 10 MG/ML IV BOLUS
INTRAVENOUS | Status: DC | PRN
  Administered 2016-10-03: 150 mg via INTRAVENOUS

## 2016-10-03 MED ORDER — PROMETHAZINE HCL 25 MG/ML IJ SOLN: 6.2500 mg | INTRAMUSCULAR | Status: DC | PRN

## 2016-10-03 SURGICAL SUPPLY — 64 items
ADAPTER IRRIG TUBE 2 SPIKE SOL (ADAPTER) ×6 IMPLANT
ANCHOR SUT BIOC ST 3X145 (Anchor) ×6 IMPLANT
BUR RADIUS 4.0X18.5 (BURR) ×3 IMPLANT
BUR RADIUS 5.5 (BURR) ×3 IMPLANT
CANNULA 5.75X7 CRYSTAL CLEAR (CANNULA) ×6 IMPLANT
CANNULA PARTIAL THREAD 2X7 (CANNULA) ×6 IMPLANT
CANNULA TWIST IN 8.25X9CM (CANNULA) ×6 IMPLANT
CLOSURE WOUND 1/2 X4 (GAUZE/BANDAGES/DRESSINGS) ×1
CONNECTOR PERFECT PASSER (CONNECTOR) IMPLANT
COOLER POLAR GLACIER W/PUMP (MISCELLANEOUS) ×3 IMPLANT
CRADLE LAMINECT ARM (MISCELLANEOUS) ×3 IMPLANT
DRAPE IMP U-DRAPE 54X76 (DRAPES) ×6 IMPLANT
DRAPE INCISE IOBAN 66X45 STRL (DRAPES) ×3 IMPLANT
DRAPE SHEET LG 3/4 BI-LAMINATE (DRAPES) ×3 IMPLANT
DRAPE U-SHAPE 47X51 STRL (DRAPES) ×3 IMPLANT
DURAPREP 26ML APPLICATOR (WOUND CARE) ×9 IMPLANT
ELECT REM PT RETURN 9FT ADLT (ELECTROSURGICAL) ×3
ELECTRODE REM PT RTRN 9FT ADLT (ELECTROSURGICAL) ×1 IMPLANT
GAUZE PETRO XEROFOAM 1X8 (MISCELLANEOUS) ×3 IMPLANT
GAUZE SPONGE 4X4 12PLY STRL (GAUZE/BANDAGES/DRESSINGS) ×3 IMPLANT
GLOVE BIOGEL PI IND STRL 9 (GLOVE) ×1 IMPLANT
GLOVE BIOGEL PI INDICATOR 9 (GLOVE) ×2
GLOVE SURG 9.0 ORTHO LTXF (GLOVE) ×6 IMPLANT
GOWN STRL REUS TWL 2XL XL LVL4 (GOWN DISPOSABLE) ×3 IMPLANT
GOWN STRL REUS W/ TWL LRG LVL3 (GOWN DISPOSABLE) ×1 IMPLANT
GOWN STRL REUS W/TWL LRG LVL3 (GOWN DISPOSABLE) ×2
IV LACTATED RINGER IRRG 3000ML (IV SOLUTION) ×30
IV LR IRRIG 3000ML ARTHROMATIC (IV SOLUTION) ×15 IMPLANT
KIT RM TURNOVER STRD PROC AR (KITS) ×3 IMPLANT
KIT STABILIZATION SHOULDER (MISCELLANEOUS) ×3 IMPLANT
KIT SUTURE 2.8 Q-FIX DISP (MISCELLANEOUS) IMPLANT
KIT SUTURETAK 3.0 INSERT PERC (KITS) ×6 IMPLANT
MANIFOLD NEPTUNE II (INSTRUMENTS) ×3 IMPLANT
MASK FACE SPIDER DISP (MASK) ×3 IMPLANT
MAT BLUE FLOOR 46X72 FLO (MISCELLANEOUS) ×6 IMPLANT
NDL SAFETY 18GX1.5 (NEEDLE) ×3 IMPLANT
NDL SAFETY 22GX1.5 (NEEDLE) ×3 IMPLANT
NS IRRIG 500ML POUR BTL (IV SOLUTION) ×3 IMPLANT
PACK ARTHROSCOPY SHOULDER (MISCELLANEOUS) ×3 IMPLANT
PAD WRAPON POLAR SHDR XLG (MISCELLANEOUS) ×1 IMPLANT
PASSER SUT CAPTURE FIRST (SUTURE) IMPLANT
SET TUBE SUCT SHAVER OUTFL 24K (TUBING) ×3 IMPLANT
SET TUBE TIP INTRA-ARTICULAR (MISCELLANEOUS) ×3 IMPLANT
STRAP SAFETY BODY (MISCELLANEOUS) ×3 IMPLANT
STRIP CLOSURE SKIN 1/2X4 (GAUZE/BANDAGES/DRESSINGS) ×2 IMPLANT
SUT ETHILON 4-0 (SUTURE) ×2
SUT ETHILON 4-0 FS2 18XMFL BLK (SUTURE) ×1
SUT LASSO 90 DEG SD STR (SUTURE) ×3 IMPLANT
SUT MNCRL 4-0 (SUTURE) ×2
SUT MNCRL 4-0 27XMFL (SUTURE) ×1
SUT PDS AB 0 CT1 27 (SUTURE) ×6 IMPLANT
SUT PERFECTPASSER WHITE CART (SUTURE) IMPLANT
SUT VIC AB 0 CT1 36 (SUTURE) ×6 IMPLANT
SUT VIC AB 2-0 CT2 27 (SUTURE) ×3 IMPLANT
SUTURE ETHLN 4-0 FS2 18XMF BLK (SUTURE) ×1 IMPLANT
SUTURE MAGNUM WIRE 2X48 BLK (SUTURE) ×6 IMPLANT
SUTURE MNCRL 4-0 27XMF (SUTURE) ×1 IMPLANT
SYRINGE 10CC LL (SYRINGE) ×3 IMPLANT
TAPE MICROFOAM 4IN (TAPE) ×3 IMPLANT
TUBING ARTHRO INFLOW-ONLY STRL (TUBING) ×3 IMPLANT
TUBING CONNECTING 10 (TUBING) ×2 IMPLANT
TUBING CONNECTING 10' (TUBING) ×1
WAND HAND CNTRL MULTIVAC 90 (MISCELLANEOUS) ×3 IMPLANT
WRAPON POLAR PAD SHDR XLG (MISCELLANEOUS) ×3

## 2016-10-03 NOTE — Anesthesia Procedure Notes (Addendum)
Procedure Name: Intubation Performed by: Lance Muss Pre-anesthesia Checklist: Patient identified, Patient being monitored, Timeout performed, Emergency Drugs available and Suction available Patient Re-evaluated:Patient Re-evaluated prior to inductionOxygen Delivery Method: Circle system utilized Preoxygenation: Pre-oxygenation with 100% oxygen Intubation Type: IV induction Ventilation: Mask ventilation without difficulty, Oral airway inserted - appropriate to patient size and Two handed mask ventilation required Laryngoscope Size: Mac and 3 Grade View: Grade III Tube type: Oral Tube size: 7.5 mm Number of attempts: 2 Airway Equipment and Method: Stylet,  Bougie stylet and LTA kit utilized Placement Confirmation: ETT inserted through vocal cords under direct vision,  positive ETCO2 and breath sounds checked- equal and bilateral Secured at: 23 cm Tube secured with: Tape Dental Injury: Teeth and Oropharynx as per pre-operative assessment  Difficulty Due To: Difficult Airway- due to anterior larynx Future Recommendations: Recommend- induction with short-acting agent, and alternative techniques readily available

## 2016-10-03 NOTE — Anesthesia Preprocedure Evaluation (Signed)
Anesthesia Evaluation  Patient identified by MRN, date of birth, ID band Patient awake    Reviewed: Allergy & Precautions, NPO status , Patient's Chart, lab work & pertinent test results  History of Anesthesia Complications Negative for: history of anesthetic complications  Airway Mallampati: III  TM Distance: >3 FB Neck ROM: Full    Dental no notable dental hx.    Pulmonary neg sleep apnea,    breath sounds clear to auscultation- rhonchi (-) wheezing      Cardiovascular Exercise Tolerance: Good (-) hypertension(-) CAD and (-) Past MI  Rhythm:Regular Rate:Normal - Systolic murmurs and - Diastolic murmurs    Neuro/Psych  Headaches, negative psych ROS   GI/Hepatic negative GI ROS, Neg liver ROS,   Endo/Other  negative endocrine ROSneg diabetes  Renal/GU Renal disease: hx of nephrolithiasis.     Musculoskeletal negative musculoskeletal ROS (+)   Abdominal (+) - obese,   Peds  Hematology negative hematology ROS (+)   Anesthesia Other Findings   Reproductive/Obstetrics                             Anesthesia Physical Anesthesia Plan  ASA: II  Anesthesia Plan: General   Post-op Pain Management:  Regional for Post-op pain   Induction: Intravenous  PONV Risk Score and Plan: 1 and Ondansetron and Dexamethasone  Airway Management Planned: Oral ETT  Additional Equipment:   Intra-op Plan:   Post-operative Plan: Extubation in OR  Informed Consent: I have reviewed the patients History and Physical, chart, labs and discussed the procedure including the risks, benefits and alternatives for the proposed anesthesia with the patient or authorized representative who has indicated his/her understanding and acceptance.   Dental advisory given  Plan Discussed with: CRNA and Anesthesiologist  Anesthesia Plan Comments:         Anesthesia Quick Evaluation

## 2016-10-03 NOTE — Anesthesia Post-op Follow-up Note (Cosign Needed)
Anesthesia QCDR form completed.        

## 2016-10-03 NOTE — Anesthesia Postprocedure Evaluation (Signed)
Anesthesia Post Note  Patient: Travis Wise  Procedure(s) Performed: Procedure(s) (LRB): SHOULDER ARTHROSCOPY WITH LABRAL REPAIR (Right)  Patient location during evaluation: PACU Anesthesia Type: General Level of consciousness: awake and alert and oriented Pain management: pain level controlled Vital Signs Assessment: post-procedure vital signs reviewed and stable Respiratory status: spontaneous breathing, nonlabored ventilation and respiratory function stable Cardiovascular status: blood pressure returned to baseline and stable Postop Assessment: no signs of nausea or vomiting Anesthetic complications: no     Last Vitals:  Vitals:   10/03/16 1115 10/03/16 1125  BP:  126/76  Pulse: 81 92  Resp:    Temp:  36.6 C    Last Pain:  Vitals:   10/03/16 1125  TempSrc:   PainSc: 3                  Elyzabeth Goatley

## 2016-10-03 NOTE — Op Note (Signed)
10/03/2016  11:04 AM  PATIENT:  Travis Wise  31 y.o. male  PRE-OPERATIVE DIAGNOSIS:  S43.431A Superior glenoid labrum lesion of right shoulder and subacromial bursitis  POST-OPERATIVE DIAGNOSIS:  Same  PROCEDURE:  Procedure(s): RIGHT SHOULDER ARTHROSCOPY WITH SUPERIOR LABRAL (SLAP) REPAIR AND EXTENSIVE SUBACROMIAL DEBRIDEMENT AND SUBACROMIAL DECOMPRESSION  SURGEON:  Surgeon(s) and Role:    Thornton Park, MD - Primary  ANESTHESIA:   local, general and paracervical block   PREOPERATIVE INDICATIONS:  Travis Wise is a  31 y.o. male with a diagnosis of S43.431A Superior glenoid labrum lesion of right shoulder, init who failed conservative measures and elected for surgical management.    I discussed the risks and benefits of surgery. The risks include but are not limited to infection, bleeding, nerve or blood vessel injury, joint stiffness or loss of motion, persistent pain, weakness or instability, and hardware failure and the need for further surgery. Medical risks include but are not limited to DVT and pulmonary embolism, myocardial infarction, stroke, pneumonia, respiratory failure and death. Patient understood these risks and wished to proceed.   OPERATIVE IMPLANTS: Arthrex bio suturetak anchors  2   OPERATIVE FINDINGS:  Right shoulder superior labral tear with instability of the biceps anchor  OPERATIVE PROCEDURE:  I met with the patient in the preoperative area.  I signed the right shoulder according the hospital's correct site of surgery protocol.  I answered all questions by the patient.  Patient underwent an interscalene block in the preoperative area by the anesthesia service. He was brought to the operating room and underwent general endotracheal intubation. He was then positioned in a beachchair position. All bony prominences were adequately padded including the lower extremities.  Examination under anesthesia revealed no instability with load and shift testing, and a  negative sulcus sign testing respectively.  The patient was then prepped and draped in a sterile fashion. The patient received Ancef prior to the onset of the case.  A timeout was performed to verify the patient's name, date of birth, medical record number, correct site of surgery and correct procedure to be performed.The timeout was also used to verify the patient received antibiotics that all appropriate instruments, implants and radiographic studies were available in the room. Once all in attendance were in agreement case began.  Bony landmarks were drawn out with a surgical marker along with proposed incisions. These were pre-injected with 1% lidocaine plain. An 11 blade was used to establish a posterior portal through which the arthroscope was placed in the glenohumeral joint. An anterior portal was established under direct visualization using an 18-gauge spinal needle for localization. A 5.75 mm arthroscopic cannula was inserted through the anterior portal. A full diagnostic examination of the glenohumeral joint was performed.  Patient was found to have a superior labral tear (SLAP).  There is no anterior labral tear. There was fraying of the posterior labrum. Patient had instability of the biceps anchor on the superior labrum. Patient no focal chondral defects. The rotator cuff was pristine and without injury.  An anterolateral portal was established again under direct visualization using an 18-gauge spinal needle. A 8 mm cannula was placed through this anterolateral portal.  Arthrex Bio Suturetak anchor was then placed in the anterior superior glenoid.  The nonarticular portion of the superior glenoid was debrided with a 4.58m resector shaver blade.  A 90 degree Arthrex suture lasso was then used to shuttle a single limb of this anchor under the anterior superior labrum just anterior to the biceps anchor..Marland Kitchen  Arthroscopic knot tying technique was then used to complete the capsulorrhaphy anteriorly.  A  percutaneous technique was then used to reposition the drill sleeve to allow a direct approach to the posterior aspect of the superior labrum. This was done using the Arthrex percutaneous set.  A second bio suture tack anchor was then placed using the percutaneous drill sleeve.  Again the 90 suture lasso was placed through the 8 mm anterolateral cannula. This allowed one limb of the bio suture tack anchor to be passed under the superior posterior labrum. An arthroscopic knot tying technique was then used to repair the posterior aspect of the superior labrum. Once both anchors and been placed a probe was used to check stability of the superior labrum. The superior labrum and biceps anchor were found to be very stable. No additional anchors were deemed necessary.  Final arthroscopic images were taken.  The arthroscope was then placed into the anterior portal using a switching stick. The posterior labrum was carefully evaluated. No extension of the tear into the posterior labrum was encountered. Frayed edges of the posterior labrum were gently debrided with a 4 resector shaver blade. No anterior labrum was seen on arthroscopic evaluation of the glenohumeral joint.  The glenohumeral joint was then copiously irrigated.  All arthroscopic instruments were removed.  The arthroscope was then placed in subacromial space. Extensive bursitis was encountered. A lateral portal was established using an 18-gauge spinal needle for localization. A 4.32m resector shaver blade and 90 ArthroCare wand were used to perform an extensive debridement of the bursitis. A subacromial decompression was also performed using a 5.5 mm resector shaver blade from the lateral portal.  Subacromial space was then copiously irrigated. Final arthroscopic images were taken. All arthroscopic instruments were removed.  The arthroscopic incisions were closed with 4-0 nylon. A dry, sterile dressing was applied to the right shoulder, along with a  Polar Care sleeve. Patient's right arm was then placed in an abduction sling. He was awoken and brought to PACU in stable condition. I was scrubbed and present for the entire case and all sharp and instrument counts were correct at the conclusion the case. I spoke with the patient's wife and parents in the postop consultation room to let them know the operation was successful and performed without complication.      KTimoteo Gaul MD

## 2016-10-03 NOTE — Transfer of Care (Signed)
Immediate Anesthesia Transfer of Care Note  Patient: Travis Wise  Procedure(s) Performed: Procedure(s): SHOULDER ARTHROSCOPY WITH LABRAL REPAIR (Right)  Patient Location: PACU  Anesthesia Type:General  Level of Consciousness: awake, alert  and responds to stimulation  Airway & Oxygen Therapy: Patient Spontanous Breathing and Patient connected to face mask oxygen  Post-op Assessment: Report given to RN and Post -op Vital signs reviewed and stable  Post vital signs: Reviewed and stable  Last Vitals:  Vitals:   10/03/16 0725 10/03/16 1039  BP: 115/80 128/74  Pulse: 80 (!) 107  Resp: 18   Temp:      Last Pain:  Vitals:   10/03/16 0647  TempSrc: Tympanic  PainSc: 2          Complications: No apparent anesthesia complications

## 2016-10-03 NOTE — H&P (Signed)
The patient has been re-examined, and the chart reviewed, and there have been no interval changes to the documented history and physical.    The risks, benefits, and alternatives have been discussed at length, and the patient is willing to proceed.   

## 2016-10-03 NOTE — Anesthesia Procedure Notes (Signed)
Anesthesia Regional Block: Interscalene brachial plexus block   Pre-Anesthetic Checklist: ,, timeout performed, Correct Patient, Correct Site, Correct Laterality, Correct Procedure, Correct Position, site marked, Risks and benefits discussed,  Surgical consent,  Pre-op evaluation,  At surgeon's request and post-op pain management  Laterality: Right  Prep: chloraprep       Needles:  Injection technique: Single-shot  Needle Type: Stimiplex     Needle Length: 10cm  Needle Gauge: 21     Additional Needles:   Procedures: ultrasound guided,,,,,,,,  Narrative:  Start time: 10/03/2016 7:15 AM End time: 10/03/2016 7:28 AM Injection made incrementally with aspirations every 5 mL.  Performed by: Personally  Anesthesiologist: Emiliano Welshans  Additional Notes: Functioning IV was confirmed and monitors were applied.  A Stimuplex needle was used. Sterile prep and drape,hand hygiene and sterile gloves were used.  Negative aspiration and negative test dose prior to incremental administration of local anesthetic. The patient tolerated the procedure well.

## 2016-10-09 ENCOUNTER — Ambulatory Visit: Payer: Commercial Managed Care - PPO | Attending: Orthopedic Surgery | Admitting: Physical Therapy

## 2016-10-09 DIAGNOSIS — G8929 Other chronic pain: Secondary | ICD-10-CM | POA: Insufficient documentation

## 2016-10-09 DIAGNOSIS — G43909 Migraine, unspecified, not intractable, without status migrainosus: Secondary | ICD-10-CM | POA: Insufficient documentation

## 2016-10-09 DIAGNOSIS — X58XXXA Exposure to other specified factors, initial encounter: Secondary | ICD-10-CM | POA: Insufficient documentation

## 2016-10-09 DIAGNOSIS — Z87442 Personal history of urinary calculi: Secondary | ICD-10-CM | POA: Insufficient documentation

## 2016-10-09 DIAGNOSIS — S43431A Superior glenoid labrum lesion of right shoulder, initial encounter: Secondary | ICD-10-CM | POA: Diagnosis not present

## 2016-10-09 DIAGNOSIS — M25511 Pain in right shoulder: Secondary | ICD-10-CM | POA: Diagnosis present

## 2016-10-09 NOTE — Therapy (Signed)
[facility]INE [address], [place], [address] Phone: [phone]   Fax:  [phone]  Physical Therapy Evaluation  Patient Details  Name: Travis Wise MRN: [id number] Date of Birth: 12/14/1985 Referring Provider: Dr. Martha Clan  Encounter Date: 10/09/2016      PT End of Session - 10/09/16 1517    Visit Number 1   Number of Visits 13   Date for PT Re-Evaluation 01/29/17   PT Start Time 1430   PT Stop Time 1505   PT Time Calculation (min) 35 min   Equipment Utilized During Treatment --  Shoulder sling with abduction pillow   Activity Tolerance Patient tolerated treatment well   Behavior During Therapy [facility] for tasks assessed/performed      Past Medical History:  Diagnosis Date  . Headache    MIGRAINES  . History of kidney stones   . Kidney stones     Past Surgical History:  Procedure Laterality Date  . SHOULDER ARTHROSCOPY WITH LABRAL REPAIR Right 10/03/2016   Procedure: SHOULDER ARTHROSCOPY WITH LABRAL REPAIR;  Surgeon: Juanell Fairly, MD;  Location: [facility] ORS;  Service: Orthopedics;  Laterality: Right;  . WISDOM TOOTH EXTRACTION      There were no vitals filed for this visit.       Subjective Assessment - 10/09/16 1513    Subjective Patient reports he initially hurt his arm around 10 years ago, it healed conservatively (was playing baseball) and re-injured it this spring playing softball. 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 He is R handed. Denies any numbness/tingling or fever/redness around the incision.    Limitations Lifting   Diagnostic tests Arthrogram confirmed SLAP tear, arthroscopic sx confirmed no RTC tear.    Patient Stated Goals To be able to return to work    Currently in Pain? Yes   Pain Score --  Reports very mild pain especially relative to last week   Pain Location Arm   Pain Orientation Right   Pain Descriptors / Indicators Aching   Pain Type  Surgical pain   Pain Onset In the past 7 days   Pain Frequency Intermittent     Patient has no atrophy of the intrinsic hand musculature, full elbow flexion ROM (though he does report feeling "weak"). Proper donning/doffing and positioning of his sling.   Passive ER to 40 degrees at roughly 45 degrees of abduction in supine before feeling "tightness"   Passive flexion to roughly 100-110 degrees in supine before feeling sense of tightness in shoulder.  Educated patient on anticipated healing time frame, protocol time frame, and provided protocol and HEP - passive flexion and ER to patient for inspection by surgeon at follow up tomorrow.        [facility] PT Assessment - 10/09/16 1535      Assessment   Medical Diagnosis R Slap repair with subacromial decompression and debridement   Referring Provider Dr. Martha Clan   Onset Date/Surgical Date 10/03/16   Hand Dominance Right   Next MD Visit 6/28     Precautions   Precautions --  See protocol   Required Braces or Orthoses Sling     Restrictions   Weight Bearing Restrictions Yes   RUE Weight Bearing Non weight bearing     Balance Screen   Has the patient fallen in the past 6 months No     Prior Function   Level of Independence Independent     Cognition   Overall Cognitive  Status Within Functional Limits for tasks assessed     Sensation   Light Touch Appears Intact            Objective measurements completed on examination: See above findings.                  PT Education - 10/09/16 1516    Education provided Yes   Education Details Provided handout for surgical protocol, anticipated healing timeline. Conservative stretching.    Person(s) Educated Patient   Methods Explanation;Demonstration;Handout   Comprehension Returned demonstration;Verbalized understanding             PT Long Term Goals - 10/09/16 1519      PT LONG TERM GOAL #1   Title Patient will actively flex RUE to at least 150 degrees  to return to ADLs and functional activities.    Baseline Unable due to protocol.    Time 16   Period Weeks   Status New     PT LONG TERM GOAL #2   Title Patient will report Quick Dash score of less than 15% to demonstrate improved tolerance for ADLs and functional activities.    Baseline 79.5%   Time 16   Period Weeks   Status New     PT LONG TERM GOAL #3   Title Patient will perform work related activities with no increase in pain.    Baseline Unable due to protocol.    Time 16                Plan - 10/09/16 1521    Clinical Impression Statement Patient is a pleasant 31 y/o male roughly 1 week s/p R SLAP repair without RTC or biceps involvement with concimittant subacromial decompression and debridement of bursa. He is quite active at baseline, reports pain is already much better than pre-op. He was lacking 90 degrees of flexion and abduction as well as 30 degrees of IR/ER pre-operatively actively. His ER PROM today was at the maximum of his allowed per protocol (35-40 degrees) and flexion passively was to roughly 100-110 degrees. He denies any red flag symptoms and appears to be healing well at this point. He would benefit from skilled PT services to return to work related activities as appropriate.    Clinical Presentation Stable   Clinical Decision Making Moderate   Rehab Potential Excellent   PT Frequency 2x / week   PT Duration 8 weeks   PT Treatment/Interventions Aquatic Therapy;ADLs/Self Care Home Management;Neuromuscular re-education;Manual techniques;Dry needling;Taping;Patient/family education;Therapeutic activities;Therapeutic exercise;Moist Heat;Iontophoresis 4mg /ml Dexamethasone;Electrical Stimulation;Cryotherapy;Balance training   PT Next Visit Plan Progress as appropriate per protocol    PT Home Exercise Plan Passive ER and flexion   Consulted and Agree with Plan of Care Patient      Patient will benefit from skilled therapeutic intervention in order to  improve the following deficits and impairments:  Pain, Decreased knowledge of precautions, Decreased strength, Decreased range of motion, Impaired UE functional use  Visit Diagnosis: Superior glenoid labrum lesion of right shoulder, initial encounter - Plan: PT plan of care cert/re-cert  Chronic right shoulder pain - Plan: PT plan of care cert/re-cert     Problem List Patient Active Problem List   Diagnosis Date Noted  . Migraine 07/16/2016  . Asthma 07/16/2016   Alva Garnet PT, DPT, CSCS     10/09/2016, 3:37 PM  [facility] PHYSICAL AND SPORTS MEDICINE [address] [place], [address] Phone: [phone]   Fax:  [phone]  Name: Reita ClicheCharles Kurtzman MRN: [id number] Date of Birth: 11-Jan-1986

## 2016-10-15 ENCOUNTER — Encounter: Payer: Commercial Managed Care - PPO | Admitting: Physical Therapy

## 2016-10-16 ENCOUNTER — Other Ambulatory Visit: Payer: Self-pay | Admitting: Family Medicine

## 2016-10-21 ENCOUNTER — Encounter: Payer: Commercial Managed Care - PPO | Admitting: Physical Therapy

## 2016-10-23 ENCOUNTER — Encounter: Payer: Commercial Managed Care - PPO | Admitting: Physical Therapy

## 2016-10-28 ENCOUNTER — Encounter: Payer: Commercial Managed Care - PPO | Admitting: Physical Therapy

## 2016-10-29 ENCOUNTER — Encounter: Payer: Commercial Managed Care - PPO | Admitting: Physical Therapy

## 2016-10-30 ENCOUNTER — Ambulatory Visit: Payer: Commercial Managed Care - PPO | Attending: Orthopedic Surgery | Admitting: Physical Therapy

## 2016-10-30 DIAGNOSIS — S43431A Superior glenoid labrum lesion of right shoulder, initial encounter: Secondary | ICD-10-CM | POA: Insufficient documentation

## 2016-10-30 DIAGNOSIS — M25511 Pain in right shoulder: Secondary | ICD-10-CM | POA: Insufficient documentation

## 2016-10-30 DIAGNOSIS — G8929 Other chronic pain: Secondary | ICD-10-CM | POA: Diagnosis present

## 2016-10-30 NOTE — Therapy (Signed)
[facility] PHYSICAL AND SPORTS MEDICINE [address] [place], [place], [address] Phone: [phone]   Fax:  [phone]  Physical Therapy Treatment  Patient Details  Name: Travis ClicheCharles Wise MRN: [id number] Date of Birth: 07-12-1985 Referring Provider: Dr. Martha ClanKrasinski  Encounter Date: 10/30/2016      PT End of Session - 10/30/16 1345    Visit Number 2   Number of Visits 13   Date for PT Re-Evaluation 01/29/17   PT Start Time 1253   PT Stop Time 1333   PT Time Calculation (min) 40 min   Activity Tolerance Patient tolerated treatment well   Behavior [facility] Therapy [facility] assessed/performed      Past Medical History:  Diagnosis Date  . Headache    MIGRAINES  . History of kidney stones   . Kidney stones     Past Surgical History:  Procedure Laterality Date  . SHOULDER ARTHROSCOPY WITH LABRAL REPAIR Right 10/03/2016   Procedure: SHOULDER ARTHROSCOPY WITH LABRAL REPAIR;  Surgeon: Juanell FairlyKrasinski, Kevin, MD;  Location: [facility] ORS;  Service: Orthopedics;  Laterality: Right;  . WISDOM TOOTH EXTRACTION      There were no vitals filed for this visit.      Subjective Assessment - 10/30/16 1342    Subjective Patient reports he was released from a sling last week by his MD. He was quite sore afterwards but has respected lifting guidelines. When he was in the sling he was relatively pain free.    Limitations Lifting   Diagnostic tests Arthrogram confirmed SLAP tear, arthroscopic sx confirmed no RTC tear.    Patient Stated Goals To be able to return to work    Currently in Pain? Other (Comment)  Pain with any passive motion, especially with external rotation   Pain Location Arm   Pain Orientation Right   Pain Descriptors / Indicators Aching   Pain Type Chronic pain;Surgical pain   Pain Onset In the past 7 days   Pain Frequency Intermittent      Passive ER stretching x 6 bouts total through available ROM, though patient noted even small motions  (generally 0-5 degrees) noted to have pain into biceps and triceps at varying points, PT did not push beyond pain limits and stopped short of soft tissue resistance relying on pain as guide.   Passive shoulder flexion with 3-4 points of contact up through roughly 80 degrees of flexion to tolerance of patient on multiple bouts x 30-60" holds x 5 bouts total to tolerance   High volt stim at 85V to middle and anterior deltoid as well as infraspinatus for pain control throughout session   Standing pendulums performed with PT observing x 30 seconds appropriate technique for 2 sets                            PT Education - 10/30/16 1344    Education provided Yes   Education Details Keep pain levels down, though need to increase his ROM as tolerated.    Person(s) Educated Patient   Methods Explanation;Demonstration;Handout   Comprehension Verbalized understanding;Returned demonstration             PT Long Term Goals - 10/09/16 1519      PT LONG TERM GOAL #1   Title Patient will actively flex RUE to at least 150 degrees to return to ADLs and functional activities.    Baseline Unable due to protocol.    Time 16  Period Weeks   Status New     PT LONG TERM GOAL #2   Title Patient will report Quick Dash score of less than 15% to demonstrate improved tolerance for ADLs and functional activities.    Baseline 79.5%   Time 16   Period Weeks   Status New     PT LONG TERM GOAL #3   Title Patient will perform work related activities with no increase in pain.    Baseline Unable due to protocol.    Time 16               Plan - 10/30/16 1345    Clinical Impression Statement Patient demonstrates minimal to no external rotation passively and is limited by pain in this session. He was able to tolerate up to 90 degrees of flexion passively this date with no discomfort reported. He was educated on pendulums and passive stretching to be completed at home with his wife  to tolerated range of less than 1-2 out of 10 pain.    Clinical Presentation Stable   Clinical Decision Making Moderate   Rehab Potential Excellent   PT Frequency 2x / week   PT Duration 8 weeks   PT Treatment/Interventions Aquatic Therapy;ADLs/Self Care Home Management;Neuromuscular re-education;Manual techniques;Dry needling;Taping;Patient/family education;Therapeutic activities;Therapeutic exercise;Moist Heat;Iontophoresis 4mg /ml Dexamethasone;Electrical Stimulation;Cryotherapy;Balance training   PT Next Visit Plan Progress as appropriate per protocol    PT Home Exercise Plan Passive ER and flexion   Consulted and Agree with Plan of Care Patient      Patient will benefit from skilled therapeutic intervention in order to improve the following deficits and impairments:  Pain, Decreased knowledge of precautions, Decreased strength, Decreased range of motion, Impaired UE functional use  Visit Diagnosis: Superior glenoid labrum lesion of right shoulder, initial encounter  Chronic right shoulder pain     Problem List Patient Active Problem List   Diagnosis Date Noted  . Migraine 07/16/2016  . Asthma 07/16/2016    Travis Wise PT, DPT, CSCS    10/30/2016, 5:09 PM  [place] [facility] PHYSICAL AND SPORTS MEDICINE [address], [place], [address] Phone: [phone]   Fax:  [phone]  Name: Travis Wise MRN: [id number] Date of Birth: 09/06/1985

## 2016-10-30 NOTE — Patient Instructions (Signed)
Passive ER stretching   Passive shoulder flexion   High volt stim at 85V to middle and anterior deltoid as well as infraspinatus

## 2016-11-01 ENCOUNTER — Encounter: Payer: Commercial Managed Care - PPO | Admitting: Physical Therapy

## 2016-11-04 ENCOUNTER — Ambulatory Visit: Payer: Commercial Managed Care - PPO | Admitting: Physical Therapy

## 2016-11-04 DIAGNOSIS — M25511 Pain in right shoulder: Secondary | ICD-10-CM

## 2016-11-04 DIAGNOSIS — G8929 Other chronic pain: Secondary | ICD-10-CM

## 2016-11-04 DIAGNOSIS — S43431A Superior glenoid labrum lesion of right shoulder, initial encounter: Secondary | ICD-10-CM | POA: Diagnosis not present

## 2016-11-05 NOTE — Therapy (Signed)
[facility]ICINE [address] [place], [address] Phone: [phone]   Fax:  [phone]  Physical Therapy Treatment  Patient Details  Name: Travis Wise MRN: [id number] Date of Birth: 1985-10-30 Referring Provider: Dr. Martha Clan  Encounter Date: 11/04/2016      PT End of Session - 11/05/16 1230    Visit Number 3   Number of Visits 13   Date for PT Re-Evaluation 01/29/17   PT Start Time 1620   PT Stop Time 1700   PT Time Calculation (min) 40 min   Activity Tolerance Patient tolerated treatment well   Behavior During Therapy [facility] for tasks assessed/performed      Past Medical History:  Diagnosis Date  . Headache    MIGRAINES  . History of kidney stones   . Kidney stones     Past Surgical History:  Procedure Laterality Date  . SHOULDER ARTHROSCOPY WITH LABRAL REPAIR Right 10/03/2016   Procedure: SHOULDER ARTHROSCOPY WITH LABRAL REPAIR;  Surgeon: Juanell Fairly, MD;  Location: [facility] ORS;  Service: Orthopedics;  Laterality: Right;  . WISDOM TOOTH EXTRACTION      There were no vitals filed for this visit.      Subjective Assessment - 11/04/16 1622    Subjective Patient reports on Friday he was sore, like after he worked out. Saturday he reports he was in severe pain all day, Sunday and so far today this has calmed down and he feels like it is some looser.    Limitations Lifting   Diagnostic tests Arthrogram confirmed SLAP tear, arthroscopic sx confirmed no RTC tear.    Patient Stated Goals To be able to return to work    Currently in Pain? Other (Comment)   Pain Score --  Patient reports soreness in the superior portion of his glenohumeral joint but mild compared to over the weekend   Pain Location Shoulder   Pain Orientation Right   Pain Descriptors / Indicators Aching   Pain Type Chronic pain;Surgical pain   Pain Onset In the past 7 days   Pain Frequency Constant     Check PROM into ER,  still quite limited with 0-5 degrees likely where his tolerance is currently.   Performed passive sagital plane and scapular plane PROM x 4 bouts total ranging from 60-90" oscillating slowly from roughly 30-45 degrees of flexion to roughly 90 degrees of overhead motion. Patient did have increase in discomfort during, but minimal and monitored by therapist.   Barbaraann Boys with PVC through 0-45 degrees of forward elevation x 6 repetitions, patient reported absolutely no pain while completing.   Soft tissue mobilization provided throughout superior and anterior glenohumeral joint and AC joint regions with tenderness noted, patient reported decrease in tenderness with prolonged bout/exposure.   Grade I superior-inferior joint mobilization to R G-H joint with no discomfort noted x 30" for 2 bouts                            PT Education - 11/05/16 1230    Education provided Yes   Education Details Continue to minimize use of RUE, go only through flexion ROM and will d/c passive external motion.    Person(s) Educated Patient   Methods Explanation   Comprehension Verbalized understanding             PT Long Term Goals - 10/09/16 1519      PT LONG TERM GOAL #1  Title Patient will actively flex RUE to at least 150 degrees to return to ADLs and functional activities.    Baseline Unable due to protocol.    Time 16   Period Weeks   Status New     PT LONG TERM GOAL #2   Title Patient will report Quick Dash score of less than 15% to demonstrate improved tolerance for ADLs and functional activities.    Baseline 79.5%   Time 16   Period Weeks   Status New     PT LONG TERM GOAL #3   Title Patient will perform work related activities with no increase in pain.    Baseline Unable due to protocol.    Time 16               Plan - 11/05/16 1231    Clinical Impression Statement Patient had significant increase in pain with passive ER ROM, thus will d/c from therapy and  work on [employer]OM when appropriate. Passive and AAROM through tolerated range was more appropriate in sagital plane this date with no immediate adverse reactions noted. Will continue to focus on increasing sagital plane motion and pain control in follow up appointments.    Clinical Presentation Stable   Clinical Decision Making Moderate   Rehab Potential Excellent   PT Frequency 2x / week   PT Duration 8 weeks   PT Treatment/Interventions Aquatic Therapy;ADLs/Self Care Home Management;Neuromuscular re-education;Manual techniques;Dry needling;Taping;Patient/family education;Therapeutic activities;Therapeutic exercise;Moist Heat;Iontophoresis 4mg /ml Dexamethasone;Electrical Stimulation;Cryotherapy;Balance training   PT Next Visit Plan Progress as appropriate per protocol    PT Home Exercise Plan Passive ER and flexion   Consulted and Agree with Plan of Care Patient      Patient will benefit from skilled therapeutic intervention in order to improve the following deficits and impairments:  Pain, Decreased knowledge of precautions, Decreased strength, Decreased range of motion, Impaired UE functional use  Visit Diagnosis: Superior glenoid labrum lesion of right shoulder, initial encounter  Chronic right shoulder pain     Problem List Patient Active Problem List   Diagnosis Date Noted  . Migraine 07/16/2016  . Asthma 07/16/2016   Alva GarnetPatrick Schwanda Zima PT, DPT, CSCS    11/05/2016, 12:33 PM  [place] [facility] [address] [place], [place], [address] Phone: [phone]   Fax:  [phone]  Name: Travis ClicheCharles Wise MRN: [id number] Date of Birth: [birth year]

## 2016-11-07 ENCOUNTER — Ambulatory Visit: Payer: Commercial Managed Care - PPO | Admitting: Physical Therapy

## 2016-11-11 ENCOUNTER — Ambulatory Visit: Payer: Commercial Managed Care - PPO | Admitting: Physical Therapy

## 2016-11-11 DIAGNOSIS — S43431A Superior glenoid labrum lesion of right shoulder, initial encounter: Secondary | ICD-10-CM

## 2016-11-11 DIAGNOSIS — M25511 Pain in right shoulder: Secondary | ICD-10-CM

## 2016-11-11 DIAGNOSIS — G8929 Other chronic pain: Secondary | ICD-10-CM

## 2016-11-11 NOTE — Patient Instructions (Signed)
AAROM into ER with dowel in supine   AAROM into flexion with dowel in supine (to 130 degrees)   PROM into flexion in supine

## 2016-11-12 NOTE — Therapy (Signed)
[facility] [address] [place], [place], [address] Phone: [phone]   Fax:  [phone]  Physical Therapy Treatment  Patient Details  Name: Travis Wise MRN: [id number] Date of Birth: 1986/03/02 Referring Provider: Dr. Martha ClanKrasinski  Encounter Date: 11/11/2016      PT End of Session - 11/12/16 0923    Visit Number 4   Number of Visits 13   Date for PT Re-Evaluation 01/29/17   PT Start Time 0900   PT Stop Time 0945   PT Time Calculation (min) 45 min   Activity Tolerance Patient tolerated treatment well   Behavior During Therapy [facility] for tasks assessed/performed      Past Medical History:  Diagnosis Date  . Headache    MIGRAINES  . History of kidney stones   . Kidney stones     Past Surgical History:  Procedure Laterality Date  . SHOULDER ARTHROSCOPY WITH LABRAL REPAIR Right 10/03/2016   Procedure: SHOULDER ARTHROSCOPY WITH LABRAL REPAIR;  Surgeon: Juanell FairlyKrasinski, Kevin, MD;  Location: [facility] ORS;  Service: Orthopedics;  Laterality: Right;  . WISDOM TOOTH EXTRACTION      There were no vitals filed for this visit.      Subjective Assessment - 11/11/16 0913    Subjective Patient reports he is continuing to have R shoulder and some lateral elbow pain, reports it feels like it did before the surgery. He reports he feels more motion in his R shoulder, but still has    Limitations Lifting   Diagnostic tests Arthrogram confirmed SLAP tear, arthroscopic sx confirmed no RTC tear.    Patient Stated Goals To be able to return to work    Currently in Pain? Other (Comment)   Pain Onset In the past 7 days      AAROM into ER with dowel in supine x 4 bouts x 10-12 repetitions with cuing to go through relatively pain free ROM  AAROM into flexion with dowel in supine (to 130 degrees) x 4 bouts x 10 repetitions through relatively pain free ROM -- patient noted tightness in as his elevation angle increased.   PROM into  flexion in supine through tolerated ROM (noted to be below 90 degrees before muscle guarding prevented further ROM) x 3 bouts x 30"   After ROM completion patient had some discomfort in distal biceps/triceps area and tenderness around [facility] joint.  Provided high volt stimulation to posterior deltoid at 80V continuous and performed soft tissue mobilization over long head of biceps tendon, supraspinatus tendon, and anterior deltoid with reduction in discomfort noted after completion.                            PT Education - 11/12/16 [id number]    Education provided Yes   Education Details Minimize use of RUE and likely needs to use sling throughout the day periodically for pain control as this is the top priority for now.    Person(s) Educated Patient   Methods Explanation   Comprehension Verbalized understanding             PT Long Term Goals - 10/09/16 1519      PT LONG TERM GOAL #1   Title Patient will actively flex RUE to at least 150 degrees to return to ADLs and functional activities.    Baseline Unable due to protocol.    Time 16   Period Weeks   Status New  PT LONG TERM GOAL #2   Title Patient will report Quick Dash score of less than 15% to demonstrate improved tolerance for ADLs and functional activities.    Baseline 79.5%   Time 16   Period Weeks   Status New     PT LONG TERM GOAL #3   Title Patient will perform work related activities with no increase in pain.    Baseline Unable due to protocol.    Time 16               Plan - 11/12/16 0923    Clinical Impression Statement Patient continues to use his RUE likely more than desired at this stage of rehab. DIscussed with patient he needed to reduce use of RUE and likely needs to begin using sling again until his pain is better controlled. He continues to lack significant ER ROM, however this will be gradually restored with ROM exercises as his pain subsides.    Clinical Presentation Stable    Clinical Decision Making Moderate   Rehab Potential Excellent   PT Frequency 2x / week   PT Duration 8 weeks   PT Treatment/Interventions Aquatic Therapy;ADLs/Self Care Home Management;Neuromuscular re-education;Manual techniques;Dry needling;Taping;Patient/family education;Therapeutic activities;Therapeutic exercise;Moist Heat;Iontophoresis 4mg /ml Dexamethasone;Electrical Stimulation;Cryotherapy;Balance training   PT Next Visit Plan Progress as appropriate per protocol    PT Home Exercise Plan Passive ER and flexion   Consulted and Agree with Plan of Care Patient      Patient will benefit from skilled therapeutic intervention in order to improve the following deficits and impairments:  Pain, Decreased knowledge of precautions, Decreased strength, Decreased range of motion, Impaired UE functional use  Visit Diagnosis: Superior glenoid labrum lesion of right shoulder, initial encounter  Chronic right shoulder pain     Problem List Patient Active Problem List   Diagnosis Date Noted  . Migraine 07/16/2016  . Asthma 07/16/2016   Alva GarnetPatrick McNamara PT, DPT, CSCS    11/12/2016, 9:25 AM  [facility] [address] [place], [place], [address] Phone: [phone]   Fax:  [phone]  Name: Travis Wise MRN: [id number] Date of Birth: [birth year]

## 2016-11-14 ENCOUNTER — Ambulatory Visit: Payer: Commercial Managed Care - PPO | Attending: Orthopedic Surgery | Admitting: Physical Therapy

## 2016-11-14 DIAGNOSIS — S43431A Superior glenoid labrum lesion of right shoulder, initial encounter: Secondary | ICD-10-CM | POA: Diagnosis present

## 2016-11-14 DIAGNOSIS — M25511 Pain in right shoulder: Secondary | ICD-10-CM | POA: Insufficient documentation

## 2016-11-14 DIAGNOSIS — G8929 Other chronic pain: Secondary | ICD-10-CM | POA: Diagnosis present

## 2016-11-14 NOTE — Therapy (Signed)
[facility] [address] [place], [place], [address] Phone: [phone]   Fax:  [phone]  Physical Therapy Treatment  Patient Details  Name: Travis Wise MRN: [id number] Date of Birth: 29-Jan-1986 Referring Provider: Dr. Martha ClanKrasinski  Encounter Date: 11/14/2016      PT End of Session - 11/14/16 1439    Visit Number 5   Number of Visits 13   Date for PT Re-Evaluation 01/29/17   PT Start Time 1428   PT Stop Time 1505   PT Time Calculation (min) 37 min   Activity Tolerance Patient tolerated treatment well   Behavior During Therapy [facility] for tasks assessed/performed      Past Medical History:  Diagnosis Date  . Headache    MIGRAINES  . History of kidney stones   . Kidney stones     Past Surgical History:  Procedure Laterality Date  . SHOULDER ARTHROSCOPY WITH LABRAL REPAIR Right 10/03/2016   Procedure: SHOULDER ARTHROSCOPY WITH LABRAL REPAIR;  Surgeon: Juanell FairlyKrasinski, Kevin, MD;  Location: [facility] ORS;  Service: Orthopedics;  Laterality: Right;  . WISDOM TOOTH EXTRACTION      There were no vitals filed for this visit.      Subjective Assessment - 11/14/16 1429    Subjective Patient reports he has been wearing the sling at night, and some throughout the day which has really helped with reducing his pain levels. He has not had to ice for most of the week, and has largely felt pain free today.    Limitations Lifting   Diagnostic tests Arthrogram confirmed SLAP tear, arthroscopic sx confirmed no RTC tear.    Patient Stated Goals To be able to return to work    Currently in Pain? Yes   Pain Onset In the past 7 days     Supine AAROM into ER with dowel x 4 bouts x 10-15 repetitions per bout with cuing for appropriate set-up and technique through comfortable ROM.   Supine AAROM to 135 degrees with dowel x 5 bouts x 10-15 repetitions per bout through comfortable ROM, notable for spot around 90 degrees of flexion where he  experienced some discomfort before feeling tightness at end range flexion.    Up to 141 degrees   PROM in flexion (very gently into ER with minimal abduction) with 100V high volt continuous applied to anterior and posterior deltoid                             PT Education - 11/14/16 1450    Education provided Yes   Education Details Work on ER with TENS and heat over the weekend.    Person(s) Educated Patient   Methods Explanation;Demonstration;Handout   Comprehension Verbalized understanding;Returned demonstration             PT Long Term Goals - 10/09/16 1519      PT LONG TERM GOAL #1   Title Patient will actively flex RUE to at least 150 degrees to return to ADLs and functional activities.    Baseline Unable due to protocol.    Time 16   Period Weeks   Status New     PT LONG TERM GOAL #2   Title Patient will report Quick Dash score of less than 15% to demonstrate improved tolerance for ADLs and functional activities.    Baseline 79.5%   Time 16   Period Weeks   Status New  PT LONG TERM GOAL #3   Title Patient will perform work related activities with no increase in pain.    Baseline Unable due to protocol.    Time 16               Plan - 11/14/16 1442    Clinical Impression Statement Patient continues to have increasing ROM into flexion with AAROM. His ER PROM and AAROM are still limited to <5 degrees before soft tissue end feel and pain. He did have good response/pain relief with increased use of sling as discussed with therapist, will wean out again as tolerated.    Clinical Presentation Stable   Clinical Decision Making Moderate   Rehab Potential Excellent   PT Frequency 2x / week   PT Duration 8 weeks   PT Treatment/Interventions Aquatic Therapy;ADLs/Self Care Home Management;Neuromuscular re-education;Manual techniques;Dry needling;Taping;Patient/family education;Therapeutic activities;Therapeutic exercise;Moist  Heat;Iontophoresis 4mg /ml Dexamethasone;Electrical Stimulation;Cryotherapy;Balance training   PT Next Visit Plan Progress as appropriate per protocol    PT Home Exercise Plan Passive ER and flexion   Consulted and Agree with Plan of Care Patient      Patient will benefit from skilled therapeutic intervention in order to improve the following deficits and impairments:  Pain, Decreased knowledge of precautions, Decreased strength, Decreased range of motion, Impaired UE functional use  Visit Diagnosis: Superior glenoid labrum lesion of right shoulder, initial encounter  Chronic right shoulder pain     Problem List Patient Active Problem List   Diagnosis Date Noted  . Migraine 07/16/2016  . Asthma 07/16/2016   Alva GarnetPatrick Radford Pease PT, DPT, CSCS    11/14/2016, 3:16 PM  [facility] [address] [place], [place], [address] Phone: [phone]   Fax:  [phone]  Name: Travis Wise MRN: [id number] Date of Birth: July 23, 1985

## 2016-11-14 NOTE — Patient Instructions (Addendum)
Supine AAROM into ER with dowel   Supine AAROM to 135 degrees with dowel   Up to 141 degrees   PROM in flexion (very gently into ER with minimal abduction) with 100V high volt continuous applied to anterior and posterior deltoid

## 2016-11-18 ENCOUNTER — Ambulatory Visit: Payer: Commercial Managed Care - PPO | Admitting: Physical Therapy

## 2016-11-18 DIAGNOSIS — S43431A Superior glenoid labrum lesion of right shoulder, initial encounter: Secondary | ICD-10-CM

## 2016-11-18 DIAGNOSIS — M25511 Pain in right shoulder: Secondary | ICD-10-CM

## 2016-11-18 DIAGNOSIS — G8929 Other chronic pain: Secondary | ICD-10-CM

## 2016-11-18 NOTE — Therapy (Signed)
[place] [facility] [address], [place], [address] Phone: [phone]   Fax:  [phone]  Physical Therapy Treatment  Patient Details  Name: Travis Wise MRN: [id number] Date of Birth: 12-Sep-1985 Referring Provider: Dr. Martha Clan  Encounter Date: 11/18/2016      PT End of Session - 11/18/16 0929    Visit Number 6   Number of Visits 13   Date for PT Re-Evaluation 01/29/17   PT Start Time 0907   PT Stop Time 0945   PT Time Calculation (min) 38 min   Activity Tolerance Patient tolerated treatment well   Behavior During Therapy [facility] for tasks assessed/performed      Past Medical History:  Diagnosis Date  . Headache    MIGRAINES  . History of kidney stones   . Kidney stones     Past Surgical History:  Procedure Laterality Date  . SHOULDER ARTHROSCOPY WITH LABRAL REPAIR Right 10/03/2016   Procedure: SHOULDER ARTHROSCOPY WITH LABRAL REPAIR;  Surgeon: Juanell Fairly, MD;  Location: [facility] ORS;  Service: Orthopedics;  Laterality: Right;  . WISDOM TOOTH EXTRACTION      There were no vitals filed for this visit.      Subjective Assessment - 11/18/16 0906    Subjective Patient reports he thinks he slept on his arm wrong on Saturday night, has had some soreness (comes and goes). Reports he has been "super easy" on his R shoulder recently.    Limitations Lifting   Diagnostic tests Arthrogram confirmed SLAP tear, arthroscopic sx confirmed no RTC tear.    Patient Stated Goals To be able to return to work    Currently in Pain? Yes   Pain Location Shoulder   Pain Orientation Right   Pain Descriptors / Indicators Aching   Pain Type Chronic pain;Surgical pain   Pain Onset In the past 7 days   Pain Frequency Intermittent      AAROM flexion x 12 for 3 sets with dowel with mild increase in pain around 90 degrees at superior portion of his shoulder.   PROM ER x 3 bouts x 30" -- noted to have increasing discomfort  over lateral elbow and posterior upper arm in the distribution of the triceps   AAROM ER with dowel in supine x 12 repetitions x 2 sets, again reported pain around lateral elbow and posterior upper arm.   Triceps/thoracic spine stretching with PVC pipe x 2 bouts x 10 repetitions (initially uncomfortable, however patient reported that this alleviated discomfort in the elbow he was feeling with passive ER stretching.   AAROM to 145 degrees   PROM in ER at roughly 45 degrees (felt much less discomfort) -- provided E-stim high volt continuous for the conclusion to help ameliorate any additional pain/discomfort. Noted to get to roughly 5-10 degrees in this bout.  Isometric ER -- 2 sets x 10 repetitions for 1" holds with "gentle" resistance cuing                           PT Education - 11/18/16 0929    Education provided Yes   Education Details Try stretching triceps and thoracic spine into extension   Person(s) Educated Patient   Methods Explanation;Demonstration;Handout   Comprehension Verbalized understanding;Returned demonstration             PT Long Term Goals - 10/09/16 1519      PT LONG TERM GOAL #1   Title Patient  will actively flex RUE to at least 150 degrees to return to ADLs and functional activities.    Baseline Unable due to protocol.    Time 16   Period Weeks   Status New     PT LONG TERM GOAL #2   Title Patient will report Quick Dash score of less than 15% to demonstrate improved tolerance for ADLs and functional activities.    Baseline 79.5%   Time 16   Period Weeks   Status New     PT LONG TERM GOAL #3   Title Patient will perform work related activities with no increase in pain.    Baseline Unable due to protocol.    Time 16               Plan - 11/18/16 0929    Clinical Impression Statement Patient appears to have some tricep related tightness/pain symptoms with end range of motion (shoulder flexion, elbow flexion) which  reduces and allows for more passive external rotation stretching motion than in previous sessions once addressed. Provided this as part of his HEP, his shoulder flexion AAROM in supine has progressed well and is now tolerable/comfortable up to 145 degrees of motion.    Clinical Presentation Stable   Clinical Decision Making Moderate   Rehab Potential Excellent   PT Frequency 2x / week   PT Duration 8 weeks   PT Treatment/Interventions Aquatic Therapy;ADLs/Self Care Home Management;Neuromuscular re-education;Manual techniques;Dry needling;Taping;Patient/family education;Therapeutic activities;Therapeutic exercise;Moist Heat;Iontophoresis 4mg /ml Dexamethasone;Electrical Stimulation;Cryotherapy;Balance training   PT Next Visit Plan Progress as appropriate per protocol    PT Home Exercise Plan Passive ER and flexion   Consulted and Agree with Plan of Care Patient      Patient will benefit from skilled therapeutic intervention in order to improve the following deficits and impairments:  Pain, Decreased knowledge of precautions, Decreased strength, Decreased range of motion, Impaired UE functional use  Visit Diagnosis: Superior glenoid labrum lesion of right shoulder, initial encounter  Chronic right shoulder pain     Problem List Patient Active Problem List   Diagnosis Date Noted  . Migraine 07/16/2016  . Asthma 07/16/2016   Alva GarnetPatrick Shavonne Ambroise PT, DPT, CSCS    11/18/2016, 11:13 AM  [place] [facility] [address] [place], [place], [address] Phone: [phone]   Fax:  [phone]  Name: Travis Wise MRN: [id number] Date of Birth: Oct 23, 1985

## 2016-11-18 NOTE — Patient Instructions (Addendum)
AAROM flexion  PROM ER  AAROM ER  Triceps/thoracic spine stretching with PVC pipe   AAROM to 145 degrees   PROM in ER at roughly 45 degrees (felt much less discomfort)  Isometric ER -- 2 sets x 10 repetitions for 1" holds with "gentle" resistance cuing

## 2016-11-21 ENCOUNTER — Ambulatory Visit: Payer: Commercial Managed Care - PPO | Admitting: Physical Therapy

## 2016-11-21 DIAGNOSIS — M25511 Pain in right shoulder: Secondary | ICD-10-CM

## 2016-11-21 DIAGNOSIS — S43431A Superior glenoid labrum lesion of right shoulder, initial encounter: Secondary | ICD-10-CM | POA: Diagnosis not present

## 2016-11-21 DIAGNOSIS — G8929 Other chronic pain: Secondary | ICD-10-CM

## 2016-11-21 NOTE — Patient Instructions (Addendum)
AAROM with PVC (felt a bit tight and limited at the top)  PROM   AAROM to 145- 150 degrees in supine with PVC   AAROM with PVC pipe x 15 for 3 sets   AAROM with PVC in ER in supine   AAROM with PVC in reclined position on wedge (145 degrees).

## 2016-11-21 NOTE — Therapy (Signed)
[place] [facility] [address] [place], [place], [address] Phone: [phone]   Fax:  [phone]  Physical Therapy Treatment  Patient Details  Name: Travis Wise MRN: [id number] Date of Birth: 1986/02/10 Referring Provider: Dr. Martha ClanKrasinski  Encounter Date: 11/21/2016      PT End of Session - 11/21/16 1010    Visit Number 7   Number of Visits 13   Date for PT Re-Evaluation 01/29/17   PT Start Time 0943   PT Stop Time 1027   PT Time Calculation (min) 44 min   Activity Tolerance Patient tolerated treatment well   Behavior During Therapy [facility] for tasks assessed/performed      Past Medical History:  Diagnosis Date  . Headache    MIGRAINES  . History of kidney stones   . Kidney stones     Past Surgical History:  Procedure Laterality Date  . SHOULDER ARTHROSCOPY WITH LABRAL REPAIR Right 10/03/2016   Procedure: SHOULDER ARTHROSCOPY WITH LABRAL REPAIR;  Surgeon: Juanell FairlyKrasinski, Kevin, MD;  Location: [facility] ORS;  Service: Orthopedics;  Laterality: Right;  . WISDOM TOOTH EXTRACTION      There were no vitals filed for this visit.      Subjective Assessment - 11/21/16 0944    Subjective Patient reports he has had less "pain" and more soreness (like he worked out) throughout the shoulder (especially in the area of supraspinatus). He has been able to sleep on his R arm and not wake up with pain.    Limitations Lifting   Diagnostic tests Arthrogram confirmed SLAP tear, arthroscopic sx confirmed no RTC tear.    Patient Stated Goals To be able to return to work    Currently in Pain? Yes   Pain Score --  "Feels like I've been working out at the gym all week".   Pain Location Shoulder   Pain Orientation Right   Pain Descriptors / Indicators Sore   Pain Type Chronic pain;Surgical pain   Pain Onset In the past 7 days   Pain Frequency Constant         AAROM with PVC (felt a bit tight and limited at the top) in supine x 15  repetitions x 3 sets  PROM  Into flexion, PT continues to note soft tissue end feel at end range flexion with no discomfort noted, x 2 minutes oscillating to end range.   AAROM to 145- 150 degrees in supine with PVC   AAROM with PVC pipe x 15 for 3 sets on wedge  AAROM with PVC in ER in supine x 12 for 3 sets - well tolerated cuing for appropriate motion.   AAROM with PVC in reclined position on wedge (145 degrees).                          PT Education - 11/21/16 1256    Education provided Yes   Education Details Continue to limit use of RUE, focus on ER PROM/AAROM over the weekend.    Person(s) Educated Patient   Methods Explanation;Demonstration   Comprehension Verbalized understanding;Returned demonstration             PT Long Term Goals - 10/09/16 1519      PT LONG TERM GOAL #1   Title Patient will actively flex RUE to at least 150 degrees to return to ADLs and functional activities.    Baseline Unable due to protocol.    Time 16  Period Weeks   Status New     PT LONG TERM GOAL #2   Title Patient will report Quick Dash score of less than 15% to demonstrate improved tolerance for ADLs and functional activities.    Baseline 79.5%   Time 16   Period Weeks   Status New     PT LONG TERM GOAL #3   Title Patient will perform work related activities with no increase in pain.    Baseline Unable due to protocol.    Time 16               Plan - 11/21/16 1010    Clinical Impression Statement Patient continues to gain RUE ROM into flexion, though his ER PROM/AAROM is still quite limited. He was able to get to roughly 160 degrees of AAROM flexion this date in supine, 145 degrees of AAROM flexion on wedge. He is progressing well, though continues to use his RUE at work likely more than would be advisable.    Clinical Presentation Stable   Clinical Decision Making Moderate   Rehab Potential Excellent   PT Frequency 2x / week   PT Duration 8  weeks   PT Treatment/Interventions Aquatic Therapy;ADLs/Self Care Home Management;Neuromuscular re-education;Manual techniques;Dry needling;Taping;Patient/family education;Therapeutic activities;Therapeutic exercise;Moist Heat;Iontophoresis 4mg /ml Dexamethasone;Electrical Stimulation;Cryotherapy;Balance training   PT Next Visit Plan Progress as appropriate per protocol    PT Home Exercise Plan Passive ER and flexion   Consulted and Agree with Plan of Care Patient      Patient will benefit from skilled therapeutic intervention in order to improve the following deficits and impairments:  Pain, Decreased knowledge of precautions, Decreased strength, Decreased range of motion, Impaired UE functional use  Visit Diagnosis: Superior glenoid labrum lesion of right shoulder, initial encounter  Chronic right shoulder pain     Problem List Patient Active Problem List   Diagnosis Date Noted  . Migraine 07/16/2016  . Asthma 07/16/2016   Alva Garnet PT, DPT, CSCS    11/21/2016, 12:59 PM  [place] [facility] PHYSICAL AND SPORTS MEDICINE [address] [place], [address] Phone: [phone]   Fax:  [phone]  Name: Travis Wise MRN: [id number] Date of Birth: 23-Mar-1986

## 2016-11-25 ENCOUNTER — Ambulatory Visit: Payer: Commercial Managed Care - PPO | Admitting: Physical Therapy

## 2016-11-25 DIAGNOSIS — M25511 Pain in right shoulder: Secondary | ICD-10-CM

## 2016-11-25 DIAGNOSIS — S43431A Superior glenoid labrum lesion of right shoulder, initial encounter: Secondary | ICD-10-CM

## 2016-11-25 DIAGNOSIS — G8929 Other chronic pain: Secondary | ICD-10-CM

## 2016-11-25 NOTE — Therapy (Signed)
[place] [facility] [address], [place], [address] Phone: [phone]   Fax:  [phone]  Physical Therapy Treatment  Patient Details  Name: Travis Wise MRN: [id number] Date of Birth: 04/21/1985 Referring Provider: Dr. Martha Clan  Encounter Date: 11/25/2016      PT End of Session - 11/25/16 1100    Visit Number 8   Number of Visits 13   Date for PT Re-Evaluation 01/29/17   PT Start Time 1056   PT Stop Time 1140   PT Time Calculation (min) 44 min   Activity Tolerance Patient tolerated treatment well   Behavior During Therapy [facility] for tasks assessed/performed      Past Medical History:  Diagnosis Date  . Headache    MIGRAINES  . History of kidney stones   . Kidney stones     Past Surgical History:  Procedure Laterality Date  . SHOULDER ARTHROSCOPY WITH LABRAL REPAIR Right 10/03/2016   Procedure: SHOULDER ARTHROSCOPY WITH LABRAL REPAIR;  Surgeon: Juanell Fairly, MD;  Location: [facility] ORS;  Service: Orthopedics;  Laterality: Right;  . WISDOM TOOTH EXTRACTION      There were no vitals filed for this visit.      Subjective Assessment - 11/25/16 1058    Subjective Patient reports he saw his MD this morning and was cleared to return to work related activities (no lifting > 5 pounds). He reports he had no pain at all over the weekend and has been able to hold his daughter with both hands while on his back.    Limitations Lifting   Diagnostic tests Arthrogram confirmed SLAP tear, arthroscopic sx confirmed no RTC tear.    Patient Stated Goals To be able to return to work    Currently in Pain? No/denies      Supine AAROM with PVC pipe into shoulder flexion to 155 degrees (140 initially) x 2 sets x 20 repetitions   AAROM into ER in supine x 2 sets x 12 repetitions   ER Isometrics x 8 for 5" holds at 0-20 degrees abduction x3 sets   AAROM on wedge with PVC x 12 through tolerable range  X 2 sets   UE  Ranger through full range tolerated (140-150 degrees) with assistance on eccentric lowering x 10 for 3 sets   UE Ranger (neutral to lateral rotations through tolerated ROM) x 8 for 2 sets -- well tolerated   Supine PROM with High volt stim to 100V of continuous current on posterior and middle deltoid while performing PROM ER at 20-45 degrees of abduction x 8 minutes with oscillations at end range.                            PT Education - 11/25/16 1100    Education provided Yes   Education Details Will progress to strengthening in next session, need to continue to increase his flexion and ER AAROM.    Person(s) Educated Patient   Methods Explanation   Comprehension Verbalized understanding             PT Long Term Goals - 10/09/16 1519      PT LONG TERM GOAL #1   Title Patient will actively flex RUE to at least 150 degrees to return to ADLs and functional activities.    Baseline Unable due to protocol.    Time 16   Period Weeks   Status New     PT  LONG TERM GOAL #2   Title Patient will report Quick Dash score of less than 15% to demonstrate improved tolerance for ADLs and functional activities.    Baseline 79.5%   Time 16   Period Weeks   Status New     PT LONG TERM GOAL #3   Title Patient will perform work related activities with no increase in pain.    Baseline Unable due to protocol.    Time 16               Plan - 11/25/16 1259    Clinical Impression Statement Patient continues to make progress with RUE flexion AAROM. His ER PROM/AAROM continue to be limited, discussed with patient need to continue to work on ROM at home as he has not been consistent with this. Per his protocol he can be progressed to progressive strengthening program in follow up session, he tolerated isometrics well in this session.    Clinical Presentation Stable   Clinical Decision Making Moderate   Rehab Potential Excellent   PT Frequency 2x / week   PT Duration  8 weeks   PT Treatment/Interventions Aquatic Therapy;ADLs/Self Care Home Management;Neuromuscular re-education;Manual techniques;Dry needling;Taping;Patient/family education;Therapeutic activities;Therapeutic exercise;Moist Heat;Iontophoresis 4mg /ml Dexamethasone;Electrical Stimulation;Cryotherapy;Balance training   PT Next Visit Plan Progress as appropriate per protocol    PT Home Exercise Plan Passive ER and flexion   Consulted and Agree with Plan of Care Patient      Patient will benefit from skilled therapeutic intervention in order to improve the following deficits and impairments:  Pain, Decreased knowledge of precautions, Decreased strength, Decreased range of motion, Impaired UE functional use  Visit Diagnosis: Superior glenoid labrum lesion of right shoulder, initial encounter  Chronic right shoulder pain     Problem List Patient Active Problem List   Diagnosis Date Noted  . Migraine 07/16/2016  . Asthma 07/16/2016    Alva GarnetPatrick Gilmore List PT, DPT, CSCS    11/25/2016, 1:01 PM  [place] [facility] [address] [place], [place], [address] Phone: [phone]   Fax:  [phone]  Name: Travis Wise MRN: [id number] Date of Birth: 11/02/1985

## 2016-11-25 NOTE — Patient Instructions (Signed)
Supine AAROM with PVC pipe into shoulder flexion to 155 degrees (140 initially)  AAROM into ER in supine   ER Isometrics x 8 for 5" holds at 0-20 degrees abduction x3 sets   AAROM on wedge with PVC x 12 through tolerable range

## 2016-11-28 ENCOUNTER — Encounter: Payer: Commercial Managed Care - PPO | Admitting: Physical Therapy

## 2016-11-29 ENCOUNTER — Ambulatory Visit: Payer: Commercial Managed Care - PPO | Admitting: Physical Therapy

## 2016-12-02 ENCOUNTER — Ambulatory Visit: Payer: Commercial Managed Care - PPO | Admitting: Physical Therapy

## 2016-12-03 ENCOUNTER — Ambulatory Visit: Payer: Commercial Managed Care - PPO | Admitting: Physical Therapy

## 2016-12-03 DIAGNOSIS — S43431A Superior glenoid labrum lesion of right shoulder, initial encounter: Secondary | ICD-10-CM | POA: Diagnosis not present

## 2016-12-03 DIAGNOSIS — G8929 Other chronic pain: Secondary | ICD-10-CM

## 2016-12-03 DIAGNOSIS — M25511 Pain in right shoulder: Secondary | ICD-10-CM

## 2016-12-03 NOTE — Therapy (Signed)
[place] [facility] PH[facility] SPORTS MEDICINE [address], [place], [address] Phone: [phone]   Fax:  [phone]  Physical Therapy Treatment  Patient Details  Name: Travis Wise MRN: [id number] Date of Birth: 1985-04-20 Referring Provider: Dr. Martha Clan  Encounter Date: 12/03/2016      PT End of Session - 12/03/16 1130    Visit Number 9   Number of Visits 13   Date for PT Re-Evaluation 01/29/17   PT Start Time 0805   PT Stop Time 0848   PT Time Calculation (min) 43 min   Activity Tolerance Patient tolerated treatment well   Behavior During Therapy [facility] for tasks assessed/performed      Past Medical History:  Diagnosis Date  . Headache    MIGRAINES  . History of kidney stones   . Kidney stones     Past Surgical History:  Procedure Laterality Date  . SHOULDER ARTHROSCOPY WITH LABRAL REPAIR Right 10/03/2016   Procedure: SHOULDER ARTHROSCOPY WITH LABRAL REPAIR;  Surgeon: Juanell Fairly, MD;  Location: [facility] ORS;  Service: Orthopedics;  Laterality: Right;  . WISDOM TOOTH EXTRACTION      There were no vitals filed for this visit.      Subjective Assessment - 12/03/16 0810    Subjective Patient reports his shoulder has been feeling good and he has been working, however he continues to have almost constant throbbing R lateral elbow and distal triceps pain. Reports no pain in the shoulder area itself.    Limitations Lifting   Diagnostic tests Arthrogram confirmed SLAP tear, arthroscopic sx confirmed no RTC tear.    Patient Stated Goals To be able to return to work    Currently in Pain? Yes   Pain Score --  Intermittent throbbing with movement in his R elbow (triceps and mass extensor origin) areas   Pain Location Elbow   Pain Orientation Right   Pain Descriptors / Indicators Aching;Throbbing   Pain Type Surgical pain   Pain Onset More than a month ago   Pain Frequency Intermittent      STM to teres/infra and mass  extensor origin in forearm -- reported areas of focal pressure/pain, appeared to have alleviation of symptoms after completion.   TDN (unbilled) to mass extensor origin -- no local twitch response noted, however patient reported the provocative movement of flexing/extending his elbow was less painful after completion.   Sidelying ER x 12 through appropriate range with towel roll between ribcage and humerus -- patient reported he was beginning to get pain in his elbow.  PNF rhythmic stab with gentle multi-directional hold challenges x 30" x 2 through range in supine < 90 degrees, well tolerated.   PROM into ER and PROM into flexion -- ER continues to be limited to 0-5 degrees, flexion to roughly 150-160 degrees before soft tissue end feel noted.                            PT Education - 12/03/16 1130    Education provided Yes   Education Details Elbow pain may be referred from infraspinatus/teres minor, will attempt to work on those for relief.    Person(s) Educated Patient   Methods Explanation   Comprehension Verbalized understanding             PT Long Term Goals - 10/09/16 1519      PT LONG TERM GOAL #1   Title Patient will actively flex RUE  to at least 150 degrees to return to ADLs and functional activities.    Baseline Unable due to protocol.    Time 16   Period Weeks   Status New     PT LONG TERM GOAL #2   Title Patient will report Quick Dash score of less than 15% to demonstrate improved tolerance for ADLs and functional activities.    Baseline 79.5%   Time 16   Period Weeks   Status New     PT LONG TERM GOAL #3   Title Patient will perform work related activities with no increase in pain.    Baseline Unable due to protocol.    Time 16               Plan - 12/03/16 1130    Clinical Impression Statement Patient continues to have significant pain in his R elbow, he reports he talked to the MD about this and has not had a definitive  answer. Attempted TDN, soft tissue mobilization, and ischemic pressure holds today with mild relief. It appears to be referring from infraspinatus and teres minor as when he completes pure ER at the shoulder or PROM into ER both refer pain down to his lateral elbow. Educated patient to continue with stretching into ER to reduce symptoms as his PROM into ER is almost non-existent.    Clinical Presentation Stable   Clinical Decision Making Moderate   Rehab Potential Excellent   PT Frequency 2x / week   PT Duration 8 weeks   PT Treatment/Interventions Aquatic Therapy;ADLs/Self Care Home Management;Neuromuscular re-education;Manual techniques;Dry needling;Taping;Patient/family education;Therapeutic activities;Therapeutic exercise;Moist Heat;Iontophoresis 4mg /ml Dexamethasone;Electrical Stimulation;Cryotherapy;Balance training   PT Next Visit Plan Progress as appropriate per protocol    PT Home Exercise Plan Passive ER and flexion   Consulted and Agree with Plan of Care Patient      Patient will benefit from skilled therapeutic intervention in order to improve the following deficits and impairments:  Pain, Decreased knowledge of precautions, Decreased strength, Decreased range of motion, Impaired UE functional use  Visit Diagnosis: Superior glenoid labrum lesion of right shoulder, initial encounter  Chronic right shoulder pain     Problem List Patient Active Problem List   Diagnosis Date Noted  . Migraine 07/16/2016  . Asthma 07/16/2016   Alva Garnet PT, DPT, CSCS    12/03/2016, 11:33 AM  [place] [facility] PHYSICAL AND SPORTS MEDICINE [address] [place], [address] Phone: [phone]   Fax:  [phone]  Name: Travis Wise MRN: [id number] Date of Birth: 12-24-1985

## 2016-12-05 ENCOUNTER — Ambulatory Visit: Payer: Commercial Managed Care - PPO | Admitting: Physical Therapy

## 2016-12-09 ENCOUNTER — Encounter: Payer: Commercial Managed Care - PPO | Admitting: Physical Therapy

## 2016-12-10 ENCOUNTER — Ambulatory Visit: Payer: Commercial Managed Care - PPO

## 2016-12-10 DIAGNOSIS — M25511 Pain in right shoulder: Secondary | ICD-10-CM

## 2016-12-10 DIAGNOSIS — G8929 Other chronic pain: Secondary | ICD-10-CM

## 2016-12-10 DIAGNOSIS — S43431A Superior glenoid labrum lesion of right shoulder, initial encounter: Secondary | ICD-10-CM

## 2016-12-10 NOTE — Therapy (Signed)
[place] [address], [place], [address] Phone: [phone]   Fax:  [phone]  Physical Therapy Treatment  Patient Details  Name: Travis Wise MRN: [id number] Date of Birth: Sep 29, 1985 Referring Provider: Dr. Martha Clan  Encounter Date: 12/10/2016      PT End of Session - 12/10/16 1109    Visit Number 10   Number of Visits 13   Date for PT Re-Evaluation 01/29/17   PT Start Time 1115   PT Stop Time 1200   PT Time Calculation (min) 45 min   Activity Tolerance Patient tolerated treatment well   Behavior During Therapy [facility] for tasks assessed/performed      Past Medical History:  Diagnosis Date  . Headache    MIGRAINES  . History of kidney stones   . Kidney stones     Past Surgical History:  Procedure Laterality Date  . SHOULDER ARTHROSCOPY WITH LABRAL REPAIR Right 10/03/2016   Procedure: SHOULDER ARTHROSCOPY WITH LABRAL REPAIR;  Surgeon: Juanell Fairly, MD;  Location: [facility] ORS;  Service: Orthopedics;  Laterality: Right;  . WISDOM TOOTH EXTRACTION      There were no vitals filed for this visit.      Subjective Assessment - 12/10/16 1108    Subjective Pt reports that he is doing well today. He does not have any shoulder pain upon arrival. He continues to report R elbow pain but none currently. He went tubing last weekend but states that he didn't hold on or use his RUE at all. He states that he had some relief from TDN during last session but the pain relief didn't last. No specific questions or concerns at this time.    Limitations Lifting   Diagnostic tests Arthrogram confirmed SLAP tear, arthroscopic sx confirmed no RTC tear.    Patient Stated Goals To be able to return to work    Currently in Pain? No/denies            TREATMENT  Manual Therapy PROM into ER, flexion, and scaption with holds at end range, never through resistance, firm end feel with mild pain at end range; R  shoulder grade II-III AP mobilizations progressing through ER ROM, 30s/bout x 6 bouts, pt with notable gains in ER during mobilizations; R shoulder grade II-III AP mobs at neutral, 30s/bout x 3 bouts; R shoulder inferior mobs at 90 scaption, grade II-III, 30s/bout x 3 bouts, reproduces lateral elbow pain; R shoulder inferior and posterior mobs at available end range flexion 30s/bout x 3 bouts; STM to teres minor/infraspinatus and posterior deltoid in sitting x 5 minutes, unable to reproduce elbow pain; Able to minimally reproduce lateral elbow pain with resisted R wrist extension, pt reports using a push mower and performing repeated wrist extension. Encouraged cheap elbow strap to see if this helps with pain. Pt reports pain over common extensor origin at lateral epicondyle of elbow;  Ther-ex Sidelying ER 2 x 10 through appropriate range with towel roll between ribcage and humerus, gentle manual resistance with no pain; Rythmic stabilization at 90 flexion, above elbow with gentle multi-directional hold challenges 30s x 2; Short arc D2 flexion/extension pattern with gentle manual resistance above the elbow x 10;                      PT Education - 12/10/16 1109    Education provided Yes   Education Details Try tennis elbow strap, prognosis and plan of care   Person(s) Educated  Patient   Methods Explanation   Comprehension Verbalized understanding             PT Long Term Goals - 10/09/16 1519      PT LONG TERM GOAL #1   Title Patient will actively flex RUE to at least 150 degrees to return to ADLs and functional activities.    Baseline Unable due to protocol.    Time 16   Period Weeks   Status New     PT LONG TERM GOAL #2   Title Patient will report Quick Dash score of less than 15% to demonstrate improved tolerance for ADLs and functional activities.    Baseline 79.5%   Time 16   Period Weeks   Status New     PT LONG TERM GOAL #3   Title Patient will  perform work related activities with no increase in pain.    Baseline Unable due to protocol.    Time 16               Plan - 12/10/16 1109    Clinical Impression Statement Pt continues to lack R shoulder external ROM but this improves with AP mobs while progressing through external rotation. He is also lacking scaption and reports reproduction of R elbow pain with inferior mobs at available end range scaption. Pt reports using a push mower a lot since the surgery with a lot of wrist extension. Able to mildly reproduce lateral elbow pain with resisted R wrist extension. His elbow pain appears more likely referred for shoulder but pt encouraged to purchase a cheap tennis elbow strap to try to see if that relieves some of his elbow pain. Pt encouraged to continue HEP and follow-up as scheduled.    Clinical Presentation Stable   Clinical Decision Making Moderate   Rehab Potential Excellent   PT Frequency 2x / week   PT Duration 8 weeks   PT Treatment/Interventions Aquatic Therapy;ADLs/Self Care Home Management;Neuromuscular re-education;Manual techniques;Dry needling;Taping;Patient/family education;Therapeutic activities;Therapeutic exercise;Moist Heat;Iontophoresis 4mg /ml Dexamethasone;Electrical Stimulation;Cryotherapy;Balance training   PT Next Visit Plan Progress as appropriate per protocol, issues seated prayer stretch on table;    PT Home Exercise Plan Passive ER and flexion   Consulted and Agree with Plan of Care Patient      Patient will benefit from skilled therapeutic intervention in order to improve the following deficits and impairments:  Pain, Decreased knowledge of precautions, Decreased strength, Decreased range of motion, Impaired UE functional use  Visit Diagnosis: Superior glenoid labrum lesion of right shoulder, initial encounter  Chronic right shoulder pain     Problem List Patient Active Problem List   Diagnosis Date Noted  . Migraine 07/16/2016  . Asthma  07/16/2016   Lynnea Maizes PT, DPT   Crist Kruszka 12/10/2016, 1:32 PM  [place] [facility] [address] [place], [address] Phone: [phone]   Fax:  [phone]  Name: Travis Wise MRN: [id number] Date of Birth: 22-Oct-1985

## 2016-12-12 ENCOUNTER — Ambulatory Visit: Payer: Commercial Managed Care - PPO

## 2016-12-12 DIAGNOSIS — G8929 Other chronic pain: Secondary | ICD-10-CM

## 2016-12-12 DIAGNOSIS — S43431A Superior glenoid labrum lesion of right shoulder, initial encounter: Secondary | ICD-10-CM | POA: Diagnosis not present

## 2016-12-12 DIAGNOSIS — M25511 Pain in right shoulder: Secondary | ICD-10-CM

## 2016-12-12 NOTE — Therapy (Signed)
[place] [facility] [address] [place], [address] Phone: [phone]   Fax:  [phone]  Physical Therapy Treatment  Patient Details  Name: Travis Wise MRN: [id number] Date of Birth: 1985-09-04 Referring Provider: Dr. Martha Clan  Encounter Date: 12/12/2016      PT End of Session - 12/12/16 0938    Visit Number 11   Number of Visits 13   Date for PT Re-Evaluation 01/29/17   PT Start Time 0942   PT Stop Time 1030   PT Time Calculation (min) 48 min   Activity Tolerance Patient tolerated treatment well   Behavior During Therapy [facility] for tasks assessed/performed      Past Medical History:  Diagnosis Date  . Headache    MIGRAINES  . History of kidney stones   . Kidney stones     Past Surgical History:  Procedure Laterality Date  . SHOULDER ARTHROSCOPY WITH LABRAL REPAIR Right 10/03/2016   Procedure: SHOULDER ARTHROSCOPY WITH LABRAL REPAIR;  Surgeon: Juanell Fairly, MD;  Location: [facility] ORS;  Service: Orthopedics;  Laterality: Right;  . WISDOM TOOTH EXTRACTION      There were no vitals filed for this visit.      Subjective Assessment - 12/12/16 0938    Subjective Pt reports that he had some soreness in his R shoulder after the last session but none today. No elbow pain since the last therapy session. No specific questions or concerns at this time.    Limitations Lifting   Diagnostic tests Arthrogram confirmed SLAP tear, arthroscopic sx confirmed no RTC tear.    Patient Stated Goals To be able to return to work    Currently in Pain? No/denies             TREATMENT   Manual Therapy  PROM into ER, flexion, and scaption with holds at end range, never through resistance, firm end feel with mild pain at end range;  R shoulder grade II-III AP mobilizations progressing through ER ROM, 30s/bout x 6 bouts, pt with notable gains in ER during mobilizations;  R shoulder grade II-III AP mobs at neutral,  30s/bout x 3 bouts;  R shoulder inferior mobs at 90 scaption, grade II-III, 30s/bout x 3 bouts, reproduces lateral elbow pain;  R shoulder inferior and posterior mobs at available end range flexion 30s/bout x 3 bouts, pt reports relief from this mobilization;  STM to teres minor/infraspinatus and posterior deltoid in sitting x 5 minutes, unable to reproduce elbow pain;   Ther-ex  Rythmic stabilization at 90 flexion, above elbow with gentle multi-directional hold challenges 30s x 3;  Short arc D2 flexion/extension pattern with gentle manual resistance above the elbow 2 x 10;                        PT Education - 12/12/16 0938    Education provided Yes   Education Details Plan of care reinforced   Person(s) Educated Patient   Methods Explanation   Comprehension Verbalized understanding             PT Long Term Goals - 10/09/16 1519      PT LONG TERM GOAL #1   Title Patient will actively flex RUE to at least 150 degrees to return to ADLs and functional activities.    Baseline Unable due to protocol.    Time 16   Period Weeks   Status New     PT LONG TERM GOAL #2  Title Patient will report Quick Dash score of less than 15% to demonstrate improved tolerance for ADLs and functional activities.    Baseline 79.5%   Time 16   Period Weeks   Status New     PT LONG TERM GOAL #3   Title Patient will perform work related activities with no increase in pain.    Baseline Unable due to protocol.    Time 16               Plan - 12/12/16 [id number]    Clinical Impression Statement Pt continues to demonstrate progress with therapy. He continues to lack significant ROM in his right shoulder, primarily with external rotation. PROM improves with R shoulder mobilizations and pt reports most relief with R shoulder inferior and posterior mobs at available end range flexion. Pt encouraged to continue current HEP and follow-up as scheduled.    Clinical Presentation Stable    Rehab Potential Excellent   PT Frequency 2x / week   PT Duration 8 weeks   PT Treatment/Interventions Aquatic Therapy;ADLs/Self Care Home Management;Neuromuscular re-education;Manual techniques;Dry needling;Taping;Patient/family education;Therapeutic activities;Therapeutic exercise;Moist Heat;Iontophoresis 4mg /ml Dexamethasone;Electrical Stimulation;Cryotherapy;Balance training   PT Next Visit Plan Progress as appropriate per protocol,    PT Home Exercise Plan Passive ER and flexion   Consulted and Agree with Plan of Care Patient      Patient will benefit from skilled therapeutic intervention in order to improve the following deficits and impairments:  Pain, Decreased knowledge of precautions, Decreased strength, Decreased range of motion, Impaired UE functional use  Visit Diagnosis: Superior glenoid labrum lesion of right shoulder, initial encounter  Chronic right shoulder pain     Problem List Patient Active Problem List   Diagnosis Date Noted  . Migraine 07/16/2016  . Asthma 07/16/2016   Lynnea MaizesJason D Momen Ham PT, DPT   Dortha Neighbors 12/12/2016, 10:52 AM  [facility] [address] [place], [place], [address] Phone: [phone]   Fax:  [phone]  Name: Reita ClicheCharles Sookram MRN: [id number] Date of Birth: 09-15-1985

## 2016-12-17 ENCOUNTER — Ambulatory Visit: Payer: Commercial Managed Care - PPO | Attending: Orthopedic Surgery | Admitting: Physical Therapy

## 2016-12-17 DIAGNOSIS — M25511 Pain in right shoulder: Secondary | ICD-10-CM | POA: Insufficient documentation

## 2016-12-17 DIAGNOSIS — S43431A Superior glenoid labrum lesion of right shoulder, initial encounter: Secondary | ICD-10-CM | POA: Insufficient documentation

## 2016-12-17 DIAGNOSIS — G8929 Other chronic pain: Secondary | ICD-10-CM | POA: Diagnosis present

## 2016-12-17 NOTE — Therapy (Signed)
[facility] [address], [place], [address] Phone: [phone]   Fax:  [phone]  Physical Therapy Treatment  Patient Details  Name: Travis Wise MRN: [id number] Date of Birth: 06/04/1985 Referring Provider: Dr. Martha Clan  Encounter Date: 12/17/2016      PT End of Session - 12/17/16 1302    Visit Number 12   Number of Visits 13   Date for PT Re-Evaluation 01/29/17   PT Start Time 0945   PT Stop Time 1030   PT Time Calculation (min) 45 min   Activity Tolerance Patient tolerated treatment well   Behavior During Therapy [facility] for tasks assessed/performed      Past Medical History:  Diagnosis Date  . Headache    MIGRAINES  . History of kidney stones   . Kidney stones     Past Surgical History:  Procedure Laterality Date  . SHOULDER ARTHROSCOPY WITH LABRAL REPAIR Right 10/03/2016   Procedure: SHOULDER ARTHROSCOPY WITH LABRAL REPAIR;  Surgeon: Juanell Fairly, MD;  Location: [facility] ORS;  Service: Orthopedics;  Laterality: Right;  . WISDOM TOOTH EXTRACTION      There were no vitals filed for this visit.      Subjective Assessment - 12/17/16 0950    Subjective Patient reports he was able to draw a bow back from 5# to 20# all day yesterday with no residual pain today. He has not had any elbow pain in the last week since last PT visit and has been doing his HEP.    Limitations Lifting   Diagnostic tests Arthrogram confirmed SLAP tear, arthroscopic sx confirmed no RTC tear.    Patient Stated Goals To be able to return to work    Currently in Pain? No/denies        AROM shoulder flexion to 135 degrees (no pain)   AROM with 1# weight in supine to 138 degrees initially, with PROM overpressure from therapist and 2# DB progressed to 146 degrees   PROM ER initially to 18 degrees performed PROM ER x 10 minutes to tolerance with no increase in pain reported, more discomfort and stiffness felt.    Sidelying ER with towel between elbow and ribs with 1# weight x 15, 2# x 10 for 2 sets  Seated rows with 15# x 10 for 3 sets (20 # for the 2nd and 3rd set with isometric hold at end range)  Seated PROM on table into shoulder flexion x 10 repetitions (to a point of mild to moderate soreness)   Seated ER with towel between elbow and rib cage x 10 for 2 sets (still lacking ER, but much reduced discomfort in the elbow with towel and mid range) with yellow t-band                          PT Education - 12/17/16 1301    Education provided Yes   Education Details Pain is primary restriction now, will focus on ROM now to attempt to regain ROM before strength.    Person(s) Educated Patient   Methods Explanation   Comprehension Verbalized understanding             PT Long Term Goals - 10/09/16 1519      PT LONG TERM GOAL #1   Title Patient will actively flex RUE to at least 150 degrees to return to ADLs and functional activities.    Baseline Unable due to protocol.  Time 16   Period Weeks   Status New     PT LONG TERM GOAL #2   Title Patient will report Quick Dash score of less than 15% to demonstrate improved tolerance for ADLs and functional activities.    Baseline 79.5%   Time 16   Period Weeks   Status New     PT LONG TERM GOAL #3   Title Patient will perform work related activities with no increase in pain.    Baseline Unable due to protocol.    Time 16               Plan - 12/17/16 1302    Clinical Impression Statement Patient demonstrates improvement with AROM this session, though functional movements are still quite limited by decreased ER PROM/AROM. Per protocol mid range active ER movements are now allowed, which should help facilitate increase in ER ROM. His pain levels are continuing to decrease in both arm and elbow.    Clinical Presentation Stable   Clinical Decision Making Moderate   Rehab Potential Excellent   PT Frequency 2x  / week   PT Duration 8 weeks   PT Treatment/Interventions Aquatic Therapy;ADLs/Self Care Home Management;Neuromuscular re-education;Manual techniques;Dry needling;Taping;Patient/family education;Therapeutic activities;Therapeutic exercise;Moist Heat;Iontophoresis 4mg /ml Dexamethasone;Electrical Stimulation;Cryotherapy;Balance training   PT Next Visit Plan Progress as appropriate per protocol,    PT Home Exercise Plan Passive ER and flexion   Consulted and Agree with Plan of Care Patient      Patient will benefit from skilled therapeutic intervention in order to improve the following deficits and impairments:  Pain, Decreased knowledge of precautions, Decreased strength, Decreased range of motion, Impaired UE functional use  Visit Diagnosis: Superior glenoid labrum lesion of right shoulder, initial encounter  Chronic right shoulder pain     Problem List Patient Active Problem List   Diagnosis Date Noted  . Migraine 07/16/2016  . Asthma 07/16/2016   Alva GarnetPatrick Wise PT, DPT, CSCS    12/17/2016, 1:09 PM  [place] [facility] [address] [place], [place], [address] Phone: [phone]   Fax:  [phone]  Name: Reita ClicheCharles Wise MRN: [id number] Date of Birth: Aug 21, 1985

## 2016-12-17 NOTE — Patient Instructions (Signed)
AROM shoulder flexion to 135 degrees (no pain)   AROM with 1# weight in supine to 138 degrees initially, with PROM overpressure from therapist and 2# DB progressed to 146 degrees   PROM ER initially to 18 degrees   Sidelying ER with towel between elbow and ribs with 1# weight x 15, 2# x 10 for 2 sets  Seated rows with 15# x 10 for 3 sets   Seated PROM on table into shoulder flexion x 10 repetitions (to a point of mild to moderate soreness)   Seated ER with towel between elbow and rib cage x 10 for 2 sets (still lacking ER, but much reduced

## 2016-12-18 ENCOUNTER — Ambulatory Visit: Payer: Commercial Managed Care - PPO | Admitting: Physical Therapy

## 2016-12-18 DIAGNOSIS — M25511 Pain in right shoulder: Secondary | ICD-10-CM

## 2016-12-18 DIAGNOSIS — S43431A Superior glenoid labrum lesion of right shoulder, initial encounter: Secondary | ICD-10-CM

## 2016-12-18 DIAGNOSIS — G8929 Other chronic pain: Secondary | ICD-10-CM

## 2016-12-18 NOTE — Therapy (Signed)
[place] [facility] [address], [place], [address] Phone: [phone]   Fax:  [phone]  Physical Therapy Treatment  Patient Details  Name: Travis Wise MRN: [id number] Date of Birth: Dec 08, 1985 Referring Provider: Dr. Martha Clan  Encounter Date: 12/18/2016      PT End of Session - 12/18/16 1031    Visit Number 13   Number of Visits 21   Date for PT Re-Evaluation 01/29/17   PT Start Time 0732   PT Stop Time 0815   PT Time Calculation (min) 43 min   Activity Tolerance Patient tolerated treatment well   Behavior During Therapy [facility] for tasks assessed/performed      Past Medical History:  Diagnosis Date  . Headache    MIGRAINES  . History of kidney stones   . Kidney stones     Past Surgical History:  Procedure Laterality Date  . SHOULDER ARTHROSCOPY WITH LABRAL REPAIR Right 10/03/2016   Procedure: SHOULDER ARTHROSCOPY WITH LABRAL REPAIR;  Surgeon: Juanell Fairly, MD;  Location: [facility] ORS;  Service: Orthopedics;  Laterality: Right;  . WISDOM TOOTH EXTRACTION      There were no vitals filed for this visit.      Subjective Assessment - 12/18/16 0753    Subjective Patient reports he was able to shoot his bow again last night without pain. He has some soreness today, but no pain.    Limitations Lifting   Diagnostic tests Arthrogram confirmed SLAP tear, arthroscopic sx confirmed no RTC tear.    Patient Stated Goals To be able to return to work    Currently in Pain? Other (Comment)  Just soreness around his R shoulder       AROM to 142 degrees   UE Ranger through pain free ROM as tolerated x 10 repetitions for 2 sets   Joint mobilizations A-P and superior - inferior grade IV as tolerated x 4 sets for 30-60" oscillations with positive response from patient (no increase in pain).   Supine overpressure into flexion by therapist x 30" for 3 bouts to tolerance.   Supine flexion with PVC pipe through  tolerated ROM with 2# ankle weight on to increase gracity pulling into flexion   PROM ER to tolerance through very mild resistance x 5 minutes (well tolerated, though still quite limited in ROM)  AROM to 150 degrees  Chest press with scapular punches on RUE with 2# x 12, with 4# x 10, 4# x 10  3 point row position with horizontal abduction with 2# weight x 10, x 8, x 8                          PT Education - 12/18/16 1030    Education provided Yes   Education Details Provided updated HEP and goal of 160 degrees of active forward elevation by next week. Need to continue to work on ER strength and ROM.    Person(s) Educated Patient   Methods Explanation;Demonstration;Handout   Comprehension Returned demonstration;Verbalized understanding             PT Long Term Goals - 10/09/16 1519      PT LONG TERM GOAL #1   Title Patient will actively flex RUE to at least 150 degrees to return to ADLs and functional activities.    Baseline Unable due to protocol.    Time 16   Period Weeks   Status New     PT LONG  TERM GOAL #2   Title Patient will report Quick Dash score of less than 15% to demonstrate improved tolerance for ADLs and functional activities.    Baseline 79.5%   Time 16   Period Weeks   Status New     PT LONG TERM GOAL #3   Title Patient will perform work related activities with no increase in pain.    Baseline Unable due to protocol.    Time 16               Plan - 12/18/16 1031    Clinical Impression Statement Patient is able to achieve 150 degrees of active forward elevation on this date. He is having less pain and increased use of his RUE both at workand with recreational activities. His primary deficit now is in ER ROM, which he may increase by perform more active based exercises.    Clinical Presentation Stable   Clinical Decision Making Moderate   Rehab Potential Excellent   PT Frequency 2x / week   PT Duration 8 weeks   PT  Treatment/Interventions Aquatic Therapy;ADLs/Self Care Home Management;Neuromuscular re-education;Manual techniques;Dry needling;Taping;Patient/family education;Therapeutic activities;Therapeutic exercise;Moist Heat;Iontophoresis 4mg /ml Dexamethasone;Electrical Stimulation;Cryotherapy;Balance training   PT Next Visit Plan Progress as appropriate per protocol,    PT Home Exercise Plan Passive ER and flexion   Consulted and Agree with Plan of Care Patient      Patient will benefit from skilled therapeutic intervention in order to improve the following deficits and impairments:  Pain, Decreased knowledge of precautions, Decreased strength, Decreased range of motion, Impaired UE functional use  Visit Diagnosis: Superior glenoid labrum lesion of right shoulder, initial encounter  Chronic right shoulder pain     Problem List Patient Active Problem List   Diagnosis Date Noted  . Migraine 07/16/2016  . Asthma 07/16/2016   Alva GarnetPatrick Myanna Ziesmer PT, DPT, CSCS    12/18/2016, 10:34 AM  [place] [facility] [address] [place], [place], [address] Phone: [phone]   Fax:  [phone]  Name: Travis Wise MRN: [id number] Date of Birth: 11-07-1985

## 2016-12-18 NOTE — Patient Instructions (Signed)
AROM to 142 degrees   UE Ranger   Joint mobilizations A-P and superior - inferior   Supine overpressure into flexion   Supine flexion with PVC pipe   PROM ER   AROM to 150 degrees  Chest press with scapular punches on RUE with 2# x 12, with 4# x 10, 4# x 10  3 point row position with horizontal abduction

## 2016-12-24 ENCOUNTER — Ambulatory Visit: Payer: Commercial Managed Care - PPO | Admitting: Physical Therapy

## 2016-12-26 ENCOUNTER — Ambulatory Visit: Payer: Commercial Managed Care - PPO | Admitting: Physical Therapy

## 2016-12-26 DIAGNOSIS — M25511 Pain in right shoulder: Secondary | ICD-10-CM

## 2016-12-26 DIAGNOSIS — G8929 Other chronic pain: Secondary | ICD-10-CM

## 2016-12-26 DIAGNOSIS — S43431A Superior glenoid labrum lesion of right shoulder, initial encounter: Secondary | ICD-10-CM

## 2016-12-26 NOTE — Therapy (Signed)
[place] [facility] [address], [place], [address] Phone: [phone]   Fax:  [phone]  Physical Therapy Treatment  Patient Details  Name: Ardell Makarewicz MRN: [id number] Date of Birth: 06/06/1985 Referring Provider: Dr. Martha Clan  Encounter Date: 12/26/2016      PT End of Session - 12/26/16 1345    Visit Number 14   Number of Visits 21   Date for PT Re-Evaluation 01/29/17   PT Start Time 0810   PT Stop Time 0849   PT Time Calculation (min) 39 min   Activity Tolerance Patient tolerated treatment well   Behavior During Therapy [facility] for tasks assessed/performed      Past Medical History:  Diagnosis Date  . Headache    MIGRAINES  . History of kidney stones   . Kidney stones     Past Surgical History:  Procedure Laterality Date  . SHOULDER ARTHROSCOPY WITH LABRAL REPAIR Right 10/03/2016   Procedure: SHOULDER ARTHROSCOPY WITH LABRAL REPAIR;  Surgeon: Juanell Fairly, MD;  Location: [facility] ORS;  Service: Orthopedics;  Laterality: Right;  . WISDOM TOOTH EXTRACTION      There were no vitals filed for this visit.      Subjective Assessment - 12/26/16 0811    Subjective Patient reports his arm has been much better, he has been able to shoot his bow at 35#. He reports it is sore this morning, likely from sleeping on it wrong.    Limitations Lifting   Diagnostic tests Arthrogram confirmed SLAP tear, arthroscopic sx confirmed no RTC tear.    Patient Stated Goals To be able to return to work    Currently in Pain? Other (Comment)      AROM to 145 degrees initially   Soft tissue mobilization over lateral deltoid area, biceps/triceps mid bellies with reported decrease in pain/stiffness noted initialy   Supine AROM with PVC and 3# ankle weight to 155 degrees for 10 repetitions x 3 bouts with prolonged holds to increase ROM tolerated.   Standing rows with 20# x 8 repetitions for 2 sets (appropriate mechanics)    Single arm pull downs with 10# x 8 repetitions for 3 sets through limited ROM due to pain increase at end range flexion   Prone horizontal abductions (unable to do ERs) with 2# x 10 for 2 sets  Prone scaption with PT assistance for proper technique x8 for 2 sets                            PT Education - 12/26/16 1345    Education provided Yes   Education Details Need to continue to progress with ER PROM to not feel so tight in his shoulder.    Person(s) Educated Patient   Methods Explanation;Demonstration;Handout   Comprehension Verbalized understanding;Returned demonstration             PT Long Term Goals - 10/09/16 1519      PT LONG TERM GOAL #1   Title Patient will actively flex RUE to at least 150 degrees to return to ADLs and functional activities.    Baseline Unable due to protocol.    Time 16   Period Weeks   Status New     PT LONG TERM GOAL #2   Title Patient will report Quick Dash score of less than 15% to demonstrate improved tolerance for ADLs and functional activities.    Baseline 79.5%   Time 16  Period Weeks   Status New     PT LONG TERM GOAL #3   Title Patient will perform work related activities with no increase in pain.    Baseline Unable due to protocol.    Time 16               Plan - 12/26/16 1345    Clinical Impression Statement Patient has made excellent progress with forward elevation to date. He is still quite limited with his ER ROM and is strongly encouraged to continue stretching to increase his ER PROM for relief of tightness he reports. He was provided with HEP and will be transitioned to discharge in the next 1-3 weeks.    Clinical Presentation Stable   Clinical Decision Making Moderate   Rehab Potential Excellent   PT Frequency 2x / week   PT Duration 8 weeks   PT Treatment/Interventions Aquatic Therapy;ADLs/Self Care Home Management;Neuromuscular re-education;Manual techniques;Dry  needling;Taping;Patient/family education;Therapeutic activities;Therapeutic exercise;Moist Heat;Iontophoresis 4mg /ml Dexamethasone;Electrical Stimulation;Cryotherapy;Balance training   PT Next Visit Plan Progress as appropriate per protocol,    PT Home Exercise Plan Passive ER and flexion   Consulted and Agree with Plan of Care Patient      Patient will benefit from skilled therapeutic intervention in order to improve the following deficits and impairments:  Pain, Decreased knowledge of precautions, Decreased strength, Decreased range of motion, Impaired UE functional use  Visit Diagnosis: Superior glenoid labrum lesion of right shoulder, initial encounter  Chronic right shoulder pain     Problem List Patient Active Problem List   Diagnosis Date Noted  . Migraine 07/16/2016  . Asthma 07/16/2016   Alva GarnetPatrick Braelin Costlow PT, DPT, CSCS    12/26/2016, 1:47 PM  [place] [facility] [address] [place], [place], [address] Phone: [phone]   Fax:  [phone]  Name: Reita ClicheCharles Embleton MRN: [id number] Date of Birth: 07-Apr-1986

## 2016-12-26 NOTE — Patient Instructions (Signed)
AROM to 145 degrees initially   Soft tissue mobilization   Supine AROM with PVC and 3# ankle weight to 155 degrees  Standing rows with 20#  Single arm pull downs   Prone horizontal abductions (unable to do ERs)

## 2016-12-31 ENCOUNTER — Ambulatory Visit: Payer: Commercial Managed Care - PPO | Admitting: Physical Therapy

## 2016-12-31 DIAGNOSIS — M25511 Pain in right shoulder: Secondary | ICD-10-CM

## 2016-12-31 DIAGNOSIS — S43431A Superior glenoid labrum lesion of right shoulder, initial encounter: Secondary | ICD-10-CM | POA: Diagnosis not present

## 2016-12-31 DIAGNOSIS — G8929 Other chronic pain: Secondary | ICD-10-CM

## 2016-12-31 NOTE — Patient Instructions (Signed)
ER to 31 passively initially. With PT providing overpressure and stretching able to get to 38 degrees   Seated rows with 20# x 10 (too much at this time), 15# x 15 repetitions   Single arm rows x 15  AROM to 155 degrees  Standing cable ER with 5# and towel between arm and ribcage x 15 repetitions x 2 sets   Chest Press 5# x 15, 10# x 12 (began to have posterior elbow pain)   Joint mobilizations A-P ulna grade II-III mobilizations x 3 bouts x 30" with no pain reported after completion .

## 2016-12-31 NOTE — Therapy (Signed)
[place] [facility] SPORTS [facility] [address], [place], [address] Phone: [phone]   Fax:  [phone]  Physical Therapy Treatment  Patient Details  Name: Travis Wise MRN: [id number] Date of Birth: [birth year] Referring Provider: Dr. Mack Guise  Encounter Date: 12/31/2016      PT End of Session - 12/31/16 0938    Visit Number 15   Number of Visits 21   Date for PT Re-Evaluation 01/29/17   PT Start Time 0900   PT Stop Time 0945   PT Time Calculation (min) 45 min   Activity Tolerance Patient tolerated treatment well   Behavior During Therapy [facility] for tasks assessed/performed      Past Medical History:  Diagnosis Date  . Headache    MIGRAINES  . History of kidney stones   . Kidney stones     Past Surgical History:  Procedure Laterality Date  . SHOULDER ARTHROSCOPY WITH LABRAL REPAIR Right 10/03/2016   Procedure: SHOULDER ARTHROSCOPY WITH LABRAL REPAIR;  Surgeon: Thornton Park, MD;  Location: [facility] ORS;  Service: Orthopedics;  Laterality: Right;  . WISDOM TOOTH EXTRACTION      There were no vitals filed for this visit.      Subjective Assessment - 12/31/16 0906    Subjective Patient reports he had some tightness in his posterior shoulder this morning, but has resolved. He is not getting sore like he was, he is drawing his bow back to 48# with no pain.    Limitations Lifting   Diagnostic tests Arthrogram confirmed SLAP tear, arthroscopic sx confirmed no RTC tear.    Patient Stated Goals To be able to return to work    Currently in Pain? Other (Comment)  Some tightness in his posterior shoulder.      ER to 31 passively initially. With PT providing overpressure and stretching able to get to 38 degrees   Seated rows with 20# x 10 (too much at this time), 15# x 15 repetitions   Single arm rows x 15  AROM to 155 degrees  Standing cable ER with 5# and towel between arm and ribcage x 15 repetitions x 2 sets    Chest Press 5# x 15, 10# x 12 (began to have posterior elbow pain)   Joint mobilizations A-P ulna grade II-III mobilizations x 3 bouts x 30" with no pain reported after completion .                           PT Education - 12/31/16 [phone]    Education provided Yes   Education Details Patient will be transitioning out of clinic with HEP and gym based program.    Person(s) Educated Patient   Methods Explanation;Demonstration;Handout   Comprehension Verbalized understanding;Returned demonstration             PT Long Term Goals - 12/31/16 8144      PT LONG TERM GOAL #1   Title Patient will actively flex RUE to at least 150 degrees to return to ADLs and functional activities.    Baseline Unable due to protocol.    Time 16   Period Weeks   Status Achieved     PT LONG TERM GOAL #2   Title Patient will report Quick Dash score of less than 15% to demonstrate improved tolerance for ADLs and functional activities.    Baseline 79.5%   Time 16   Period Weeks   Status On-going  PT LONG TERM GOAL #3   Title Patient will perform work related activities with no increase in pain.    Baseline Unable due to protocol.    Time 16   Status Partially Met               Plan - 12/31/16 0938    Clinical Impression Statement Patient has made excellent progress with forward elevation again to 155 degrees, his primary limitation is his lack of external rotation (to 38 degrees) this date. His strength has improved significantly, and was provided with HEP to continue to progress per protocol.    Clinical Presentation Stable   Clinical Decision Making Moderate   Rehab Potential Excellent   PT Frequency 2x / week   PT Duration 8 weeks   PT Treatment/Interventions Aquatic Therapy;ADLs/Self Care Home Management;Neuromuscular re-education;Manual techniques;Dry needling;Taping;Patient/family education;Therapeutic activities;Therapeutic exercise;Moist Heat;Iontophoresis  56m/ml Dexamethasone;Electrical Stimulation;Cryotherapy;Balance training   PT Next Visit Plan Progress as appropriate per protocol,    PT Home Exercise Plan Passive ER and flexion   Consulted and Agree with Plan of Care Patient      Patient will benefit from skilled therapeutic intervention in order to improve the following deficits and impairments:  Pain, Decreased knowledge of precautions, Decreased strength, Decreased range of motion, Impaired UE functional use  Visit Diagnosis: Superior glenoid labrum lesion of right shoulder, initial encounter  Chronic right shoulder pain     Problem List Patient Active Problem List   Diagnosis Date Noted  . Migraine 07/16/2016  . Asthma 07/16/2016   Travis MacadamiaPT, DPT, CSCS    12/31/2016, 12:44 PM  [facility] [address] [place] [address]Phone: [phone]  Fax:  [phone] Name: Travis LofaroMRN: [id number] of Birth: 3February 09, 1987

## 2017-01-02 ENCOUNTER — Ambulatory Visit: Payer: Commercial Managed Care - PPO | Admitting: Physical Therapy

## 2017-01-07 ENCOUNTER — Ambulatory Visit: Payer: Commercial Managed Care - PPO | Admitting: Physical Therapy

## 2017-01-09 ENCOUNTER — Encounter: Payer: Commercial Managed Care - PPO | Admitting: Physical Therapy

## 2017-01-14 ENCOUNTER — Encounter: Payer: Commercial Managed Care - PPO | Admitting: Physical Therapy

## 2017-01-21 ENCOUNTER — Ambulatory Visit: Payer: Commercial Managed Care - PPO | Attending: Orthopedic Surgery

## 2017-01-21 ENCOUNTER — Encounter: Payer: Self-pay | Admitting: Physical Therapy

## 2017-01-21 DIAGNOSIS — S43431A Superior glenoid labrum lesion of right shoulder, initial encounter: Secondary | ICD-10-CM | POA: Diagnosis present

## 2017-01-21 DIAGNOSIS — G8929 Other chronic pain: Secondary | ICD-10-CM | POA: Diagnosis present

## 2017-01-21 DIAGNOSIS — M25511 Pain in right shoulder: Secondary | ICD-10-CM | POA: Insufficient documentation

## 2017-01-21 NOTE — Therapy (Signed)
[place] [facility] SPORTS MEDIC[facility] [address] [place], [address] Phone: [phone]   Fax:  [phone]  Physical Therapy Treatment  Patient Details  Name: Travis Wise MRN: [id number] Date of Birth: 04-Jun-1985 Referring Provider: Dr. Mack Guise  Encounter Date: 01/21/2017      PT End of Session - 01/21/17 0803    Visit Number 16   Number of Visits 37   Date for PT Re-Evaluation 03/18/17   PT Start Time 0803   PT Stop Time 0845   PT Time Calculation (min) 42 min   Activity Tolerance Patient tolerated treatment well   Behavior During Therapy [facility] for tasks assessed/performed      Past Medical History:  Diagnosis Date  . Headache    MIGRAINES  . History of kidney stones   . Kidney stones     Past Surgical History:  Procedure Laterality Date  . SHOULDER ARTHROSCOPY WITH LABRAL REPAIR Right 10/03/2016   Procedure: SHOULDER ARTHROSCOPY WITH LABRAL REPAIR;  Surgeon: Thornton Park, MD;  Location: [facility] ORS;  Service: Orthopedics;  Laterality: Right;  . WISDOM TOOTH EXTRACTION      There were no vitals filed for this visit.      Subjective Assessment - 01/21/17 0802    Subjective Pt reports that he is doing well today. He continues to report tightness in his shoulder especially first thing in the morning. He reports that he is able to complete all his work related activities without increase in pain. He is able to pull his bowstring back with a 45#-50# pull. Pt states that he has an upcoming appointment with Dr. Tanja Port. Otherwise no specific questions or concerns.    Limitations Lifting   Diagnostic tests Arthrogram confirmed SLAP tear, arthroscopic sx confirmed no RTC tear.    Patient Stated Goals To be able to return to work    Currently in Pain? No/denies  posterior shoulder tightness            [facility] PT Assessment - 01/21/17 0826      Observation/Other Assessments   Other Surveys  Other Surveys   Quick  DASH  11.36%     ROM / Strength   AROM / PROM / Strength AROM;Strength     AROM   AROM Assessment Site Shoulder   Right/Left Shoulder Right   Right Shoulder Flexion 159 Degrees   Right Shoulder ABduction 139 Degrees   Right Shoulder Internal Rotation 62 Degrees   Right Shoulder External Rotation 60 Degrees     Strength   Strength Assessment Site Shoulder;Elbow   Right/Left Shoulder Right   Right Shoulder Flexion 4/5   Right Shoulder ABduction 4/5   Right Shoulder Internal Rotation 5/5   Right Shoulder External Rotation 5/5   Right/Left Elbow Right   Right Elbow Flexion 5/5   Right Elbow Extension 5/5       TREATMENT  Manual Therapy  Moist heat pack applied to R shoulder in supine during history and prior to stretching; PROM into ER, flexion, and scaption with holds at end range, never through resistance, firm end feel with mild pain at end range;  R shoulder grade II-III AP mobilizations progressing through ER ROM, 30s/bout x 3 bouts, pt with notable gains in ER during mobilizations;  R shoulder inferior mobs at 90 scaption, grade II-III, 30s/bout x 3 bouts; R shoulder inferior and posterior mobs at available end range flexion 30s/bout x 3 bouts, pt reports relief from this mobilization;  ROM and  strength measures obtained; Pt completed QuickDASH (unbilled); Updated goals with patient;  Ther-ex OMEGA seated rows 45#  2 x 10; OMEGA lat pull downs 45# 2 x 10;  OMEGA chest press 35# 2 x 10; Standing cable ER with 5# and towel between arm and ribcage 2 x 10; Reinforced importance of frequent stretching at home and return to gym; Updated plan of care with patient;                         PT Education - 01/21/17 0803    Education provided Yes   Education Details Updated goals, plan of care, outcome measures   Person(s) Educated Patient   Methods Explanation   Comprehension Verbalized understanding             PT Long Term Goals - 01/21/17 7425       PT LONG TERM GOAL #1   Title Patient will actively flex RUE to at least 150 degrees to return to ADLs and functional activities.    Baseline Unable due to protocol. 01/21/17: 159 degrees   Time 16   Period Weeks   Status Achieved     PT LONG TERM GOAL #2   Title Patient will report Quick Dash score of less than 15% to demonstrate improved tolerance for ADLs and functional activities.    Baseline 79.5%; 01/21/17: 11.36%   Time 16   Period Weeks   Status Achieved     PT LONG TERM GOAL #3   Title Patient will perform work related activities with no increase in pain.    Baseline Unable due to protocol.    Time 16   Status Achieved     PT LONG TERM GOAL #4   Title Pt will decrease quick DASH score by at least 8% in order to demonstrate clinically significant reduction in disability.   Baseline 01/21/17: 11.36%   Time 8   Period Weeks   Status New   Target Date 03/18/17     PT LONG TERM GOAL #5   Title Pt will improve R shoulder strength for flexion and abduction to at least 4+/5 to improve work and leisure activities such as bow hunting with >60# pull   Baseline 01/21/17: 4/5 flexion and abduction, can use 45-50# pull on bowstring   Time 8   Status New   Target Date 03/18/17     Additional Long Term Goals   Additional Long Term Goals Yes     PT LONG TERM GOAL #6   Title Pt will improve R shoulder abduction to >150 degrees and external rotation to >70 degrees for functional activities such as washing upper back   Baseline 01/21/17: 139 abduction, 60 external rotation   Time 8   Status New   Target Date 03/18/17               Plan - 01/21/17 0803    Clinical Impression Statement Pt has met all three of his initial goals with therapy. He is able to complete all of his work related activities without pain and has returned to bow hunting with a 45-50# pull. His normal bow has a pull of greater than 60#. He continues to lack R shoulder mobility in external rotation  primarily but also in flexion and abduction. In addition he continues to have weakness with R shoulder flexion and abduction. He will benefit from continued PT services to address remaining deficits and return to full function with all his  activities at home and work.    Clinical Presentation Stable   Clinical Decision Making Moderate   Rehab Potential Excellent   PT Frequency 2x / week   PT Duration 8 weeks   PT Treatment/Interventions Aquatic Therapy;ADLs/Self Care Home Management;Neuromuscular re-education;Manual techniques;Dry needling;Taping;Patient/family education;Therapeutic activities;Therapeutic exercise;Moist Heat;Iontophoresis 71m/ml Dexamethasone;Electrical Stimulation;Cryotherapy;Balance training   PT Next Visit Plan Progress strength and ROM   PT Home Exercise Plan Passive ER and flexion   Consulted and Agree with Plan of Care Patient      Patient will benefit from skilled therapeutic intervention in order to improve the following deficits and impairments:  Pain, Decreased knowledge of precautions, Decreased strength, Decreased range of motion, Impaired UE functional use  Visit Diagnosis: Superior glenoid labrum lesion of right shoulder, initial encounter - Plan: PT plan of care cert/re-cert  Chronic right shoulder pain - Plan: PT plan of care cert/re-cert     Problem List Patient Active Problem List   Diagnosis Date Noted  . Migraine 07/16/2016  . Asthma 07/16/2016   JPhillips GroutPT, DPT   Huprich,Jason 01/21/2017, 12:43 PM  [facility] [address] [place] [address]Phone: [phone]  Fax:  [phone] Name: CKainan PattyMRN: [id number] of Birth: 322-Nov-1987

## 2017-01-28 ENCOUNTER — Encounter: Payer: Commercial Managed Care - PPO | Admitting: Physical Therapy

## 2017-02-04 ENCOUNTER — Encounter: Payer: Commercial Managed Care - PPO | Admitting: Physical Therapy

## 2017-02-05 ENCOUNTER — Ambulatory Visit: Payer: Commercial Managed Care - PPO | Admitting: Physical Therapy

## 2017-02-11 ENCOUNTER — Encounter: Payer: Commercial Managed Care - PPO | Admitting: Physical Therapy

## 2017-02-18 ENCOUNTER — Encounter: Payer: Commercial Managed Care - PPO | Admitting: Physical Therapy

## 2017-03-04 ENCOUNTER — Ambulatory Visit: Payer: Commercial Managed Care - PPO | Attending: Orthopedic Surgery | Admitting: Physical Therapy

## 2017-03-11 ENCOUNTER — Encounter: Payer: Commercial Managed Care - PPO | Admitting: Physical Therapy

## 2017-03-18 ENCOUNTER — Encounter: Payer: Commercial Managed Care - PPO | Admitting: Physical Therapy

## 2017-05-15 ENCOUNTER — Encounter: Payer: Self-pay | Admitting: Family Medicine

## 2017-05-15 ENCOUNTER — Ambulatory Visit: Payer: Self-pay | Admitting: *Deleted

## 2017-05-15 ENCOUNTER — Telehealth: Payer: Self-pay | Admitting: Family Medicine

## 2017-05-15 ENCOUNTER — Ambulatory Visit: Payer: Commercial Managed Care - PPO | Admitting: Family Medicine

## 2017-05-15 ENCOUNTER — Ambulatory Visit
Admission: RE | Admit: 2017-05-15 | Discharge: 2017-05-15 | Disposition: A | Discharge status: Home / Self Care | Payer: Commercial Managed Care - PPO | Source: Ambulatory Visit | Attending: Family Medicine | Admitting: Family Medicine

## 2017-05-15 VITALS — BP 129/90 | HR 86 | Temp 97.5°F | Wt 171.8 lb

## 2017-05-15 DIAGNOSIS — Z87442 Personal history of urinary calculi: Secondary | ICD-10-CM

## 2017-05-15 DIAGNOSIS — R1032 Left lower quadrant pain: Secondary | ICD-10-CM | POA: Insufficient documentation

## 2017-05-15 DIAGNOSIS — K5790 Diverticulosis of intestine, part unspecified, without perforation or abscess without bleeding: Secondary | ICD-10-CM | POA: Insufficient documentation

## 2017-05-15 DIAGNOSIS — K5730 Diverticulosis of large intestine without perforation or abscess without bleeding: Secondary | ICD-10-CM

## 2017-05-15 DIAGNOSIS — R109 Unspecified abdominal pain: Secondary | ICD-10-CM

## 2017-05-15 LAB — URINALYSIS, ROUTINE W REFLEX MICROSCOPIC
Bilirubin, UA: NEGATIVE
Glucose, UA: NEGATIVE
Ketones, UA: NEGATIVE
LEUKOCYTES UA: NEGATIVE
NITRITE UA: NEGATIVE
PH UA: 6.5 (ref 5.0–7.5)
Protein, UA: NEGATIVE
RBC, UA: NEGATIVE
Specific Gravity, UA: 1.025 (ref 1.005–1.030)
Urobilinogen, Ur: 0.2 mg/dL (ref 0.2–1.0)

## 2017-05-15 MED ORDER — TAMSULOSIN HCL 0.4 MG PO CAPS: 0.4000 mg | ORAL_CAPSULE | Freq: Every day | ORAL | 3 refills | Status: DC

## 2017-05-15 MED ORDER — OXYCODONE-ACETAMINOPHEN 5-325 MG PO TABS: 1.0000 | ORAL_TABLET | Freq: Four times a day (QID) | ORAL | 0 refills | Status: DC | PRN

## 2017-05-15 NOTE — Telephone Encounter (Signed)
rec'd call report on CT Renal Stone Study from [facility]  Was advised of the following result:  "Diverticulitis without acute Diverticulosis  Average Stool Burden  Normal Appendix  No Stone or Urinary Tract Obstruction"  Rec'd call report at 5:01 PM; [facility]; no answer.  Will send information to the Flow Coordinator.  (unable to access current On Call schedule)

## 2017-05-15 NOTE — Telephone Encounter (Signed)
Called in c/o possible kidney stone pain.   (He has had a kidney stone before).   Last Tuesday he began having left flank pain, left lower abd pain under navel, with painful urination and urgency that is becoming worse and more painful.  He also mentioned he had a little blood in his urine the last time he urinated.    I made an appt with Roosvelt Maserachel Lane, PA-C for today at 1:45.   I instructed him to go to the ED if he began having severe pain or becoming worse.   He verbalized understanding.  Reason for Disposition . [1] MILD-MODERATE pain AND [2] constant AND [3] present > 2 hours  Answer Assessment - Initial Assessment Questions 1. LOCATION: "Where does it hurt?"      Left lower abd under belly button, hurting in your left middle back.   Urinating a lot   I have the urge to a lot but don't really go.    Burning even without urination 2. RADIATION: "Does the pain shoot anywhere else?" (e.g., chest, back)     See above 3. ONSET: "When did the pain begin?" (Minutes, hours or days ago)      Last Tuesday it began but it's been getting gradually worse yesterday it was really bad. 4. SUDDEN: "Gradual or sudden onset?"     Sudden pain intermittently 5. PATTERN "Does the pain come and go, or is it constant?"    - If constant: "Is it getting better, staying the same, or worsening?"      (Note: Constant means the pain never goes away completely; most serious pain is constant and it progresses)     - If intermittent: "How long does it last?" "Do you have pain now?"     (Note: Intermittent means the pain goes away completely between bouts)     Comes and goes 6. SEVERITY: "How bad is the pain?"  (e.g., Scale 1-10; mild, moderate, or severe)    - MILD (1-3): doesn't interfere with normal activities, abdomen soft and not tender to touch     - MODERATE (4-7): interferes with normal activities or awakens from sleep, tender to touch     - SEVERE (8-10): excruciating pain, doubled over, unable to do any normal  activities       Aggravating.  7 on pain scale.  Not worse but very uncomfortable 7. RECURRENT SYMPTOM: "Have you ever had this type of abdominal pain before?" If so, ask: "When was the last time?" and "What happened that time?"      Yes.   Kidney stones 8. CAUSE: "What do you think is causing the abdominal pain?"     Kidney stones  When it happened before it was more on my side but this time it's more under my belly button. 9. RELIEVING/AGGRAVATING FACTORS: "What makes it better or worse?" (e.g., movement, antacids, bowel movement)     No 10. OTHER SYMPTOMS: "Has there been any vomiting, diarrhea, constipation, or urine problems?"       No  Protocols used: ABDOMINAL PAIN - MALE-A-AH

## 2017-05-15 NOTE — Progress Notes (Signed)
BP 129/90 (BP Location: Left Arm, Patient Position: Sitting, Cuff Size: Normal)   Pulse 86   Temp (!) 97.5 F (36.4 C) (Oral)   Wt 171 lb 12.8 oz (77.9 kg)   SpO2 99%   BMI 26.91 kg/m    Subjective:    Patient ID: Travis Wise, male    DOB: 1985/05/23, 32 y.o.   MRN: [id number]  HPI: Travis Wise is a 32 y.o. male  Chief Complaint  Patient presents with  . Flank Pain    Off and on for 9 days. Pain significantly got worse yesterday and has stayed that way. History of kidney stones.  . Abdominal Pain    Patient states he has pain under his belly button.  . Hematuria    Patient states he noticed blood in his urine yesterday.    Left flank pain radiating down toward groin at times for about 1.5 weeks, significantly worse since yesterday. Also having hematuria the past few days. Hx of kidney stones, states this feels consistent with that. No fevers, dysuria, nausea vomiting diarrhea. Has been taking OTC pain relievers with no relief.   Relevant past medical, surgical, family and social history reviewed and updated as indicated. Interim medical history since our last visit reviewed. Allergies and medications reviewed and updated.  Review of Systems  Constitutional: Negative.   Respiratory: Negative.   Cardiovascular: Negative.   Genitourinary: Positive for flank pain and hematuria.  Skin: Negative.   Neurological: Negative.   Psychiatric/Behavioral: Negative.     Per HPI unless specifically indicated above     Objective:    BP 129/90 (BP Location: Left Arm, Patient Position: Sitting, Cuff Size: Normal)   Pulse 86   Temp (!) 97.5 F (36.4 C) (Oral)   Wt 171 lb 12.8 oz (77.9 kg)   SpO2 99%   BMI 26.91 kg/m   Wt Readings from Last 3 Encounters:  05/15/17 171 lb 12.8 oz (77.9 kg)  10/03/16 165 lb (74.8 kg)  09/26/16 165 lb (74.8 kg)    Physical Exam  Constitutional: He is oriented to person, place, and time. He appears well-developed and well-nourished. He  appears distressed (appears to be in a lot of pain).  HENT:  Head: Atraumatic.  Eyes: Conjunctivae are normal. No scleral icterus.  Neck: Normal range of motion. Neck supple.  Cardiovascular: Normal rate and normal heart sounds.  Pulmonary/Chest: Effort normal and breath sounds normal. No respiratory distress.  Abdominal: Soft. Bowel sounds are normal. There is no tenderness. There is no guarding.  Musculoskeletal: Normal range of motion. He exhibits tenderness (TTP right flank).  Neurological: He is alert and oriented to person, place, and time.  Skin: Skin is warm and dry.  Psychiatric: He has a normal mood and affect. His behavior is normal.  Nursing note and vitals reviewed.  Results for orders placed or performed in visit on 05/15/17  Urinalysis, Routine w reflex microscopic  Result Value Ref Range   Specific Gravity, UA 1.025 1.005 - 1.030   pH, UA 6.5 5.0 - 7.5   Color, UA Yellow Yellow   Appearance Ur Clear Clear   Leukocytes, UA Negative Negative   Protein, UA Negative Negative/Trace   Glucose, UA Negative Negative   Ketones, UA Negative Negative   RBC, UA Negative Negative   Bilirubin, UA Negative Negative   Urobilinogen, Ur 0.2 0.2 - 1.0 mg/dL   Nitrite, UA Negative Negative      Assessment & Plan:   Problem List Items Addressed This  Visit    None    Visit Diagnoses    Acute left flank pain    -  Primary   U/A negative for RBCs, will get CT renal study given duration and severity as well as hx. Tx with flomax, oxycodone, and lots of fluids in meantime   Relevant Orders   CT RENAL STONE STUDY (Completed)   History of kidney stones       Relevant Orders   Urinalysis, Routine w reflex microscopic (Completed)       Follow up plan: Return if symptoms worsen or fail to improve.

## 2017-05-16 DIAGNOSIS — K5790 Diverticulosis of intestine, part unspecified, without perforation or abscess without bleeding: Secondary | ICD-10-CM | POA: Insufficient documentation

## 2017-05-16 NOTE — Telephone Encounter (Signed)
Called pt regarding CT results, discussed no acute findings and that likely if this was from a stone that it's passed at this point given lack of hematuria yesterday and no stones on study. Pt feeling much better today for most part, just some side soreness and biggest complaint now today is a toothache for which he will go see his dentist for. No fevers or continued abdominal pain or back pain. Discussed monitoring closely for worsening or changing sxs, can take pain medication prn and continue flomax if needed. Push fluids, rest.   Did discuss with patient that there was diverticulosis on CT result and what this meant and warning signs to watch out for in the future (diarrhea, cramping abdominal pain particularly lower, and fevers). Pt understanding of this.

## 2017-05-17 NOTE — Patient Instructions (Signed)
Follow up as needed

## 2017-06-18 ENCOUNTER — Encounter: Payer: Commercial Managed Care - PPO | Admitting: Unknown Physician Specialty

## 2017-07-01 ENCOUNTER — Encounter: Payer: Self-pay | Admitting: Unknown Physician Specialty

## 2017-08-05 ENCOUNTER — Other Ambulatory Visit: Payer: Self-pay | Admitting: Family Medicine

## 2018-05-10 ENCOUNTER — Other Ambulatory Visit: Payer: Self-pay

## 2018-05-10 ENCOUNTER — Emergency Department
Admission: EM | Admit: 2018-05-10 | Discharge: 2018-05-10 | Disposition: A | Discharge status: Home / Self Care | Payer: BLUE CROSS/BLUE SHIELD | Attending: Emergency Medicine | Admitting: Emergency Medicine

## 2018-05-10 ENCOUNTER — Encounter: Payer: Self-pay | Admitting: Emergency Medicine

## 2018-05-10 DIAGNOSIS — Y999 Unspecified external cause status: Secondary | ICD-10-CM | POA: Insufficient documentation

## 2018-05-10 DIAGNOSIS — Z23 Encounter for immunization: Secondary | ICD-10-CM | POA: Diagnosis not present

## 2018-05-10 DIAGNOSIS — Y93K9 Activity, other involving animal care: Secondary | ICD-10-CM | POA: Diagnosis not present

## 2018-05-10 DIAGNOSIS — S51812A Laceration without foreign body of left forearm, initial encounter: Secondary | ICD-10-CM | POA: Insufficient documentation

## 2018-05-10 DIAGNOSIS — Z79899 Other long term (current) drug therapy: Secondary | ICD-10-CM | POA: Diagnosis not present

## 2018-05-10 DIAGNOSIS — S59912A Unspecified injury of left forearm, initial encounter: Secondary | ICD-10-CM | POA: Diagnosis present

## 2018-05-10 DIAGNOSIS — J45909 Unspecified asthma, uncomplicated: Secondary | ICD-10-CM | POA: Diagnosis not present

## 2018-05-10 DIAGNOSIS — Y929 Unspecified place or not applicable: Secondary | ICD-10-CM | POA: Insufficient documentation

## 2018-05-10 DIAGNOSIS — W260XXA Contact with knife, initial encounter: Secondary | ICD-10-CM | POA: Insufficient documentation

## 2018-05-10 MED ORDER — TETANUS-DIPHTH-ACELL PERTUSSIS 5-2.5-18.5 LF-MCG/0.5 IM SUSP
INTRAMUSCULAR | Status: AC
  Filled 2018-05-10: qty 0.5

## 2018-05-10 MED ORDER — TETANUS-DIPHTH-ACELL PERTUSSIS 5-2.5-18.5 LF-MCG/0.5 IM SUSP
0.5000 mL | Freq: Once | INTRAMUSCULAR | Status: AC
  Administered 2018-05-10: 0.5 mL via INTRAMUSCULAR

## 2018-05-10 MED ORDER — CEPHALEXIN 500 MG PO CAPS: 500.0000 mg | ORAL_CAPSULE | Freq: Three times a day (TID) | ORAL | 0 refills | Status: AC

## 2018-05-10 MED ORDER — LIDOCAINE HCL 1 % IJ SOLN
5.0000 mL | Freq: Once | INTRAMUSCULAR | Status: DC
  Filled 2018-05-10: qty 10

## 2018-05-10 NOTE — ED Triage Notes (Signed)
Pt to ED via POV c/o laceration to the left forearm. Pt states that he accidentally cut himself with a knife. Bleeding is controlled at this time.

## 2018-05-10 NOTE — ED Notes (Signed)
Pt laceration wrapped with gauze and taped.

## 2018-05-10 NOTE — Discharge Instructions (Signed)
Soft tissue swelling surrounding the laceration should improve over the next 1 to 2 days.  If swelling worsens or if you develop numbness, tingling, worsening pain or coolness of the left hand, you should return to the emergency department immediately.  Take Keflex 3 times a day over the next week to prevent infection.

## 2018-05-10 NOTE — ED Provider Notes (Signed)
[facility] Emergency Department Provider Note  ____________________________________________  Time seen: Approximately 6:11 PM  I have reviewed the triage vital signs and the nursing notes.   HISTORY  Chief Complaint Laceration    HPI Travis Wise is a 33 y.o. male presents to the emergency department with a 2-1/2 cm linear left forearm laceration sustained accidentally with a hunting knife while skinning a [place] approximately two hours ago.  Patient denies numbness or tingling in the left forearm.  He has noticed some soft tissue swelling but states that his arm does not feel very painful.  His tetanus status is out of date. No alleviating measures have been attempted.    Past Medical History:  Diagnosis Date  . Headache    MIGRAINES  . History of kidney stones   . Kidney stones     Patient Active Problem List   Diagnosis Date Noted  . Diverticulosis 05/16/2017  . Migraine 07/16/2016  . Asthma 07/16/2016    Past Surgical History:  Procedure Laterality Date  . SHOULDER ARTHROSCOPY WITH LABRAL REPAIR Right 10/03/2016   Procedure: SHOULDER ARTHROSCOPY WITH LABRAL REPAIR;  Surgeon: Juanell FairlyKrasinski, Kevin, MD;  Location: [facility] ORS;  Service: Orthopedics;  Laterality: Right;  . WISDOM TOOTH EXTRACTION      Prior to Admission medications   Medication Sig Start Date End Date Taking? Authorizing Provider  acetaminophen (TYLENOL) 500 MG tablet Take 1,000 mg by mouth every 6 (six) hours as needed.    [provider]  albendazole (ALBENZA) 200 MG tablet Take 400 mg by mouth 2 (two) times daily.    [provider]  albuterol (PROVENTIL HFA;VENTOLIN HFA) 108 (90 Base) MCG/ACT inhaler Inhale 2 puffs into the lungs every 6 (six) hours as needed for wheezing or shortness of breath.    [provider]  cephALEXin (KEFLEX) 500 MG capsule Take 1 capsule (500 mg total) by mouth 3 (three) times daily for 7 days. 05/10/18 05/17/18  Orvil FeilWoods, Quatavious Rossa M,  PA-C  oxyCODONE-acetaminophen (PERCOCET/ROXICET) 5-325 MG tablet Take 1 tablet by mouth every 6 (six) hours as needed for severe pain. 05/15/17   Particia NearingLane, Rachel Elizabeth, PA-C  tamsulosin (FLOMAX) 0.4 MG CAPS capsule TAKE 1 CAPSULE BY MOUTH EVERY DAY 08/05/17   Particia NearingLane, Rachel Elizabeth, PA-C    Allergies Imitrex [sumatriptan]  No family history on file.  Social History Social History   Tobacco Use  . Smoking status: Never Smoker  . Smokeless tobacco: Former Engineer, waterUser  Substance Use Topics  . Alcohol use: Yes    Alcohol/week: 0.0 standard drinks    Comment: on occasion  . Drug use: No     Review of Systems  Constitutional: No fever/chills Eyes: No visual changes. No discharge ENT: No upper respiratory complaints. Cardiovascular: no chest pain. Respiratory: no cough. No SOB. Gastrointestinal: No abdominal pain.  No nausea, no vomiting.  No diarrhea.  No constipation. Genitourinary: Negative for dysuria. No hematuria Musculoskeletal: Negative for musculoskeletal pain. Skin: Patient has left forearm laceration.  Neurological: Negative for headaches, focal weakness or numbness. .  ____________________________________________   PHYSICAL EXAM:  VITAL SIGNS: ED Triage Vitals  Enc Vitals Group     BP 05/10/18 1653 122/77     Pulse Rate 05/10/18 1653 83     Resp 05/10/18 1653 16     Temp 05/10/18 1653 98.2 F (36.8 C)     Temp Source 05/10/18 1653 Oral     SpO2 05/10/18 1653 98 %     Weight --  Height --      Head Circumference --      Peak Flow --      Pain Score 05/10/18 1649 0     Pain Loc --      Pain Edu? --      Excl. in GC? --      Constitutional: Alert and oriented. Well appearing and in no acute distress. Eyes: Conjunctivae are normal. PERRL. EOMI. Head: Atraumatic. Cardiovascular: Normal rate, regular rhythm. Normal S1 and S2.  Good peripheral circulation. Respiratory: Normal respiratory effort without tachypnea or retractions. Lungs CTAB. Good air entry  to the bases with no decreased or absent breath sounds. Gastrointestinal: Bowel sounds 4 quadrants. Soft and nontender to palpation. No guarding or rigidity. No palpable masses. No distention. No CVA tenderness. Musculoskeletal: Patient has moderate amount of soft tissue swelling in vicinity of laceration along the volar aspect of left forearm.  Compartments are soft and warm.  Patient is able to move all 5 left fingers.  Palpable radial pulse, left. Neurologic:  Normal speech and language. No gross focal neurologic deficits are appreciated.  Skin: Patient has 2-1/2 cm linear laceration with adipose tissue exposure along the volar aspect of left forearm. Psychiatric: Mood and affect are normal. Speech and behavior are normal. Patient exhibits appropriate insight and judgement.   ____________________________________________   LABS (all labs ordered are listed, but only abnormal results are displayed)  Labs Reviewed - No data to display ____________________________________________  EKG   ____________________________________________  RADIOLOGY   No results found.  ____________________________________________    PROCEDURES  Procedure(s) performed:    Procedures  LACERATION REPAIR Performed by: Orvil FeilJaclyn M Leylah Tarnow Authorized by: Orvil FeilJaclyn M Rayssa Atha Consent: Verbal consent obtained. Risks and benefits: risks, benefits and alternatives were discussed Consent given by: patient Patient identity confirmed: provided demographic data Prepped and Draped in normal sterile fashion Wound explored  Laceration Location: Left forearm.   Laceration Length: 2.5 cm  No Foreign Bodies seen or palpated  Anesthesia: local infiltration  Local anesthetic: lidocaine 1% without epinephrine  Anesthetic total: 3 ml  Irrigation method: syringe Amount of cleaning: standard  Skin closure: 4-0 Ethilon   Number of sutures: 6  Technique: Simple Interrupted   Patient tolerance: Patient tolerated  the procedure well with no immediate complications.   Medications  lidocaine (XYLOCAINE) 1 % (with pres) injection 5 mL (has no administration in time range)  Tdap (BOOSTRIX) 5-2.5-18.5 LF-MCG/0.5 injection (has no administration in time range)  Tdap (BOOSTRIX) injection 0.5 mL (0.5 mLs Intramuscular Given 05/10/18 1811)     ____________________________________________   INITIAL IMPRESSION / ASSESSMENT AND PLAN / ED COURSE  Pertinent labs & imaging results that were available during my care of the patient were reviewed by me and considered in my medical decision making (see chart for details).  Review of the [place] CSRS was performed in accordance of the [facility] prior to dispensing any controlled drugs.      Assessment and plan Left forearm laceration Patient presents to the emergency department with a linear vulvar left forearm laceration sustained accidentally with a hunting knife.  There was some concern on physical exam as patient had soft tissue swelling in the vicinity of laceration.  Patient denied significant pain.  On physical exam, compartments along the left forearm are soft and warm.  Patient denied paresthesias and was able to move all 5 left digits.  Low suspicion for compartment syndrome but patient education regarding compartment syndrome was given and strict return precautions were emphasized  twice during this emergency department encounter.  Specifically, patient was advised to return to the emergency department with worsening left forearm pain, numbness or tingling in the left hand or forearm or coldness of the left upper extremity.  Patient's tetanus status was updated in the emergency department.  He was advised to have sutures removed by primary care in 1 week.  Patient was discharged with Keflex.  All patient questions were answered.    ____________________________________________  FINAL CLINICAL IMPRESSION(S) / ED DIAGNOSES  Final diagnoses:  Laceration of left  forearm, initial encounter      NEW MEDICATIONS STARTED DURING THIS VISIT:  ED Discharge Orders         Ordered    cephALEXin (KEFLEX) 500 MG capsule  3 times daily     05/10/18 1746              This chart was dictated using voice recognition software/Dragon. Despite best efforts to proofread, errors can occur which can change the meaning. Any change was purely unintentional.    Orvil Feil, PA-C 05/10/18 1817    Minna Antis, MD 05/10/18 2306

## 2018-05-10 NOTE — ED Notes (Signed)
Pt has laceration noted to left arm after accidentally cutting himself. Bleeding controlled at this time.

## 2018-05-19 ENCOUNTER — Encounter: Payer: Self-pay | Admitting: Unknown Physician Specialty

## 2018-05-19 ENCOUNTER — Ambulatory Visit (INDEPENDENT_AMBULATORY_CARE_PROVIDER_SITE_OTHER): Payer: BLUE CROSS/BLUE SHIELD | Admitting: Unknown Physician Specialty

## 2018-05-19 VITALS — BP 131/84 | HR 105 | Temp 100.1°F | Wt 164.4 lb

## 2018-05-19 DIAGNOSIS — R52 Pain, unspecified: Secondary | ICD-10-CM | POA: Diagnosis not present

## 2018-05-19 DIAGNOSIS — J101 Influenza due to other identified influenza virus with other respiratory manifestations: Secondary | ICD-10-CM | POA: Diagnosis not present

## 2018-05-19 LAB — VERITOR FLU A/B WAIVED
INFLUENZA A: POSITIVE — AB
Influenza B: NEGATIVE

## 2018-05-19 MED ORDER — OSELTAMIVIR PHOSPHATE 75 MG PO CAPS: 75.0000 mg | ORAL_CAPSULE | Freq: Two times a day (BID) | ORAL | 0 refills | Status: AC

## 2018-05-19 NOTE — Progress Notes (Signed)
BP 131/84   Pulse (!) 105   Temp 100.1 F (37.8 C) (Oral)   Wt 164 lb 6.4 oz (74.6 kg)   SpO2 97%   BMI 25.75 kg/m    Subjective:    Patient ID: Travis Wise, male    DOB: 07-15-1985, 33 y.o.   MRN: [id number]  HPI: Travis Wise is a 33 y.o. male  Chief Complaint  Patient presents with  . URI    pt states he has had congestion, cough, fever, body aches, and chills   Influenza  This is a new problem. The current episode started yesterday. The problem has been rapidly worsening. Associated symptoms include chills, congestion, coughing, fatigue, a fever, headaches and a sore throat. Pertinent negatives include no abdominal pain, chest pain or vomiting. The symptoms are aggravated by exertion and bending. He has tried acetaminophen and NSAIDs for the symptoms. The treatment provided no relief.     Relevant past medical, surgical, family and social history reviewed and updated as indicated. Interim medical history since our last visit reviewed. Allergies and medications reviewed and updated.  Review of Systems  Constitutional: Positive for chills, fatigue and fever.  HENT: Positive for congestion and sore throat.   Respiratory: Positive for cough.   Cardiovascular: Negative for chest pain.  Gastrointestinal: Negative for abdominal pain and vomiting.  Neurological: Positive for headaches.    Per HPI unless specifically indicated above     Objective:    BP 131/84   Pulse (!) 105   Temp 100.1 F (37.8 C) (Oral)   Wt 164 lb 6.4 oz (74.6 kg)   SpO2 97%   BMI 25.75 kg/m   Wt Readings from Last 3 Encounters:  05/19/18 164 lb 6.4 oz (74.6 kg)  05/15/17 171 lb 12.8 oz (77.9 kg)  10/03/16 165 lb (74.8 kg)    Physical Exam Vitals signs and nursing note reviewed.  Constitutional:      General: He is not in acute distress.    Appearance: He is well-developed. He is ill-appearing.  HENT:     Head: Normocephalic and atraumatic.     Right Ear: Tympanic membrane and ear  canal normal.     Left Ear: Tympanic membrane and ear canal normal.     Nose: Rhinorrhea present.     Right Sinus: No maxillary sinus tenderness or frontal sinus tenderness.     Left Sinus: No maxillary sinus tenderness or frontal sinus tenderness.     Mouth/Throat:     Pharynx: Uvula midline.  Eyes:     General: Lids are normal. No scleral icterus.       Right eye: No discharge.        Left eye: No discharge.     Conjunctiva/sclera: Conjunctivae normal.  Neck:     Musculoskeletal: Neck supple.  Cardiovascular:     Rate and Rhythm: Normal rate and regular rhythm.     Heart sounds: Normal heart sounds.  Pulmonary:     Effort: Pulmonary effort is normal. No respiratory distress.     Breath sounds: Normal breath sounds.  Abdominal:     Palpations: There is no hepatomegaly or splenomegaly.  Musculoskeletal: Normal range of motion.  Skin:    General: Skin is warm and dry.     Coloration: Skin is not pale.     Findings: No rash.  Neurological:     Mental Status: He is alert and oriented to person, place, and time.  Psychiatric:  Behavior: Behavior normal.        Thought Content: Thought content normal.        Judgment: Judgment normal.     Results for orders placed or performed in visit on 05/15/17  Urinalysis, Routine w reflex microscopic  Result Value Ref Range   Specific Gravity, UA 1.025 1.005 - 1.030   pH, UA 6.5 5.0 - 7.5   Color, UA Yellow Yellow   Appearance Ur Clear Clear   Leukocytes, UA Negative Negative   Protein, UA Negative Negative/Trace   Glucose, UA Negative Negative   Ketones, UA Negative Negative   RBC, UA Negative Negative   Bilirubin, UA Negative Negative   Urobilinogen, Ur 0.2 0.2 - 1.0 mg/dL   Nitrite, UA Negative Negative      Assessment & Plan:   Problem List Items Addressed This Visit    None    Visit Diagnoses    Body aches    -  Primary   Relevant Orders   Veritor Flu A/B Waived   Influenza A       Pt with positive flu.  Rx  for Tamiflu for 5 days.  Instructed to take Tylenol and Advil for fever and aches.  If SOB or resp distress instructed to go to ER   Relevant Medications   oseltamivir (TAMIFLU) 75 MG capsule      Note to be out of work for a week  Follow up plan: Return if symptoms worsen or fail to improve.

## 2018-08-28 ENCOUNTER — Encounter: Payer: Self-pay | Admitting: Family Medicine

## 2018-08-28 ENCOUNTER — Ambulatory Visit (INDEPENDENT_AMBULATORY_CARE_PROVIDER_SITE_OTHER): Payer: BLUE CROSS/BLUE SHIELD | Admitting: Family Medicine

## 2018-08-28 ENCOUNTER — Other Ambulatory Visit: Payer: Self-pay

## 2018-08-28 VITALS — HR 74 | Temp 98.0°F | Ht 68.0 in | Wt 168.0 lb

## 2018-08-28 DIAGNOSIS — M79641 Pain in right hand: Secondary | ICD-10-CM

## 2018-08-28 DIAGNOSIS — M79642 Pain in left hand: Secondary | ICD-10-CM | POA: Diagnosis not present

## 2018-08-28 DIAGNOSIS — M7989 Other specified soft tissue disorders: Secondary | ICD-10-CM | POA: Diagnosis not present

## 2018-08-28 MED ORDER — DICLOFENAC SODIUM 1 % TD GEL: 2.0000 g | Freq: Four times a day (QID) | TRANSDERMAL | 1 refills | Status: DC

## 2018-08-28 NOTE — Progress Notes (Signed)
Pulse 74   Temp 98 F (36.7 C) (Oral)   Ht _0  (1.727 m)   Wt 168 lb (76.2 kg)   BMI 25.54 kg/m    Subjective:    Patient ID: Travis Wise, male    DOB: 03/06/1986, 33 y.o.   MRN: [id number]  HPI: Travis Wise is a 33 y.o. male  Chief Complaint  Patient presents with  . Hand Pain    Patient states that his hands will lock up and bilateral hand pain. Patient states it's worse if he works a lot. Concerned for arthritis. Symptoms are off and on. Ongoing since January, symptoms have progressed.     . This visit was completed via WebEx due to the restrictions of the COVID-19 pandemic. All issues as above were discussed and addressed. Physical exam was done as above through visual confirmation on WebEx. If it was felt that the patient should be evaluated in the office, they were directed there. The patient verbally consented to this visit. . Location of the patient: home . Location of the provider: home . Those involved with this call:  . Provider: Merrie Roof, PA-C . CMA: Yvonna Alanis, [place] . Front Desk/Registration: Jill Side  . Time spent on call: 15 minutes with patient face to face via video conference. More than 50% of this time was spent in counseling and coordination of care. 5 minutes total spent in review of patient's record and preparation of their chart. I verified patient identity using two factors (patient name and date of birth). Patient consents verbally to being seen via telemedicine visit today.   Patient presenting today for about 6 months of b/l hand pain, stiffness, and intermittent swelling. Worse after working outside with work gloves on all day. States sometimes he will have areas as pronounced as ping pong balls pop up Denies redness, injury, radiation of pain, numbness or tingling. Tried tylenol with no relief, occasional ibuprofen which barely helped. Notes multiple family members have RA, very concerned about this. Also has lots of outdoor exposure  and worried about tick illness.   Relevant past medical, surgical, family and social history reviewed and updated as indicated. Interim medical history since our last visit reviewed. Allergies and medications reviewed and updated.  Review of Systems  Per HPI unless specifically indicated above     Objective:    Pulse 74   Temp 98 F (36.7 C) (Oral)   Ht _1  (1.727 m)   Wt 168 lb (76.2 kg)   BMI 25.54 kg/m   Wt Readings from Last 3 Encounters:  08/28/18 168 lb (76.2 kg)  05/19/18 164 lb 6.4 oz (74.6 kg)  05/15/17 171 lb 12.8 oz (77.9 kg)    Physical Exam Vitals signs and nursing note reviewed.  Constitutional:      General: He is not in acute distress.    Appearance: Normal appearance.  HENT:     Head: Atraumatic.     Right Ear: External ear normal.     Left Ear: External ear normal.     Nose: Nose normal. No congestion.     Mouth/Throat:     Mouth: Mucous membranes are moist.     Pharynx: Oropharynx is clear.  Eyes:     Extraocular Movements: Extraocular movements intact.     Conjunctiva/sclera: Conjunctivae normal.  Neck:     Musculoskeletal: Normal range of motion.  Cardiovascular:     Rate and Rhythm: Normal rate and regular rhythm.  Pulmonary:  Effort: Pulmonary effort is normal. No respiratory distress.  Musculoskeletal: Normal range of motion.        General: Swelling (trace edema b/l) present.  Skin:    General: Skin is dry.     Findings: No erythema or rash.  Neurological:     Mental Status: He is oriented to person, place, and time.  Psychiatric:        Mood and Affect: Mood normal.        Thought Content: Thought content normal.        Judgment: Judgment normal.     Results for orders placed or performed in visit on 08/28/18  Rocky mtn spotted fvr abs pnl(IgG+IgM)  Result Value Ref Range   RMSF IgG Positive (A) Negative   RMSF IgM 0.48 0.00 - 0.89 index  Lyme Ab/Western Blot Reflex  Result Value Ref Range   Lyme IgG/IgM Ab <0.91 0.00 -  0.90 ISR   LYME DISEASE AB, QUANT, IGM <0.80 0.00 - 0.79 index  C-reactive protein  Result Value Ref Range   CRP <1 0 - 10 mg/L  Sed Rate (ESR)  Result Value Ref Range   Sed Rate 8 0 - 15 mm/hr  Rheumatoid Factor  Result Value Ref Range   Rhuematoid fact SerPl-aCnc <10.0 0.0 - 13.9 IU/mL  ANA w/Reflex  Result Value Ref Range   Anti Nuclear Antibody (ANA) Negative Negative  CBC with Differential/Platelet  Result Value Ref Range   WBC 6.5 3.4 - 10.8 x10E3/uL   RBC 5.47 4.14 - 5.80 x10E6/uL   Hemoglobin 15.5 13.0 - 17.7 g/dL   Hematocrit 48.2 37.5 - 51.0 %   MCV 88 79 - 97 fL   MCH 28.3 26.6 - 33.0 pg   MCHC 32.2 31.5 - 35.7 g/dL   RDW 13.5 11.6 - 15.4 %   Platelets 207 150 - 450 x10E3/uL   Neutrophils 51 Not Estab. %   Lymphs 36 Not Estab. %   Monocytes 9 Not Estab. %   Eos 2 Not Estab. %   Basos 1 Not Estab. %   Neutrophils Absolute 3.3 1.4 - 7.0 x10E3/uL   Lymphocytes Absolute 2.3 0.7 - 3.1 x10E3/uL   Monocytes Absolute 0.6 0.1 - 0.9 x10E3/uL   EOS (ABSOLUTE) 0.2 0.0 - 0.4 x10E3/uL   Basophils Absolute 0.1 0.0 - 0.2 x10E3/uL   Immature Granulocytes 1 Not Estab. %   Immature Grans (Abs) 0.1 0.0 - 0.1 x10E3/uL  RMSF, IgG, IFA  Result Value Ref Range   RMSF, IGG, IFA 1:256 (H) Neg <1:64      Assessment & Plan:   Problem List Items Addressed This Visit    None    Visit Diagnoses    Swelling of both hands    -  Primary   Will check autoimmune/inflammatory labs as well as tick bourne illness labs for r/o. Diclofenac gel prn for pain and inflammation   Relevant Orders   CBC with Differential/Platelet (Completed)   ANA w/Reflex (Completed)   Rheumatoid Factor (Completed)   Sed Rate (ESR) (Completed)   C-reactive protein (Completed)   Lyme Ab/Western Blot Reflex (Completed)   Rocky mtn spotted fvr abs pnl(IgG+IgM) (Completed)   Bilateral hand pain       Diclofenac gel, lab testing, epsom salt soaks. F/u depending on lab results   Relevant Orders   CBC with  Differential/Platelet (Completed)   ANA w/Reflex (Completed)   Rheumatoid Factor (Completed)   Sed Rate (ESR) (Completed)   C-reactive protein (Completed)  Lyme Ab/Western Blot Reflex (Completed)   Rocky mtn spotted fvr abs pnl(IgG+IgM) (Completed)       Follow up plan: Return if symptoms worsen or fail to improve.

## 2018-08-31 ENCOUNTER — Other Ambulatory Visit: Payer: Self-pay | Admitting: Family Medicine

## 2018-08-31 ENCOUNTER — Telehealth: Payer: Self-pay | Admitting: Family Medicine

## 2018-08-31 MED ORDER — DOXYCYCLINE HYCLATE 100 MG PO TABS: 100.0000 mg | ORAL_TABLET | Freq: Two times a day (BID) | ORAL | 0 refills | Status: DC

## 2018-08-31 NOTE — Telephone Encounter (Signed)
Called pt and left VM, will go ahead and post results on mychart and discussed in VM to call back if he had any questions about them

## 2018-09-01 LAB — RHEUMATOID FACTOR: Rhuematoid fact SerPl-aCnc: <10 IU/mL (ref 0.0–13.9)

## 2018-09-01 LAB — CBC WITH DIFFERENTIAL/PLATELET
Basophils Absolute: 0.1 10*3/uL (ref 0.0–0.2)
Basos: 1 %
EOS (ABSOLUTE): 0.2 10*3/uL (ref 0.0–0.4)
Eos: 2 %
Hematocrit: 48.2 % (ref 37.5–51.0)
Hemoglobin: 15.5 g/dL (ref 13.0–17.7)
Immature Grans (Abs): 0.1 10*3/uL (ref 0.0–0.1)
Immature Granulocytes: 1 %
Lymphocytes Absolute: 2.3 10*3/uL (ref 0.7–3.1)
Lymphs: 36 %
MCH: 28.3 pg (ref 26.6–33.0)
MCHC: 32.2 g/dL (ref 31.5–35.7)
MCV: 88 fL (ref 79–97)
Monocytes Absolute: 0.6 10*3/uL (ref 0.1–0.9)
Monocytes: 9 %
Neutrophils Absolute: 3.3 10*3/uL (ref 1.4–7.0)
Neutrophils: 51 %
Platelets: 207 10*3/uL (ref 150–450)
RBC: 5.47 x10E6/uL (ref 4.14–5.80)
RDW: 13.5 % (ref 11.6–15.4)
WBC: 6.5 10*3/uL (ref 3.4–10.8)

## 2018-09-01 LAB — ROCKY MTN SPOTTED FVR ABS PNL(IGG+IGM)
RMSF IgG: POSITIVE — AB
RMSF IgM: 0.48 index (ref 0.00–0.89)

## 2018-09-01 LAB — LYME AB/WESTERN BLOT REFLEX
LYME DISEASE AB, QUANT, IGM: <0.8 index (ref 0.00–0.79)
Lyme IgG/IgM Ab: <0.91 {ISR} (ref 0.00–0.90)

## 2018-09-01 LAB — SEDIMENTATION RATE: Sed Rate: 8 mm/hr (ref 0–15)

## 2018-09-01 LAB — C-REACTIVE PROTEIN: CRP: <1 mg/L (ref 0–10)

## 2018-09-01 LAB — RMSF, IGG, IFA: RMSF, IGG, IFA: 1:256 {titer} — ABNORMAL HIGH

## 2018-09-01 LAB — ANA W/REFLEX: Anti Nuclear Antibody (ANA): NEGATIVE

## 2018-09-08 ENCOUNTER — Encounter: Payer: Self-pay | Admitting: Family Medicine

## 2018-09-09 ENCOUNTER — Other Ambulatory Visit: Payer: Self-pay | Admitting: Family Medicine

## 2018-09-09 MED ORDER — PREDNISONE 10 MG PO TABS: ORAL_TABLET | ORAL | 0 refills | Status: DC

## 2019-03-30 IMAGING — CT CT RENAL STONE PROTOCOL
3 of 4 series · 10 of 46 positions shown, 17 images · non-contrast
Comparison: 08/04/2010

CLINICAL DATA: Left lower quadrant pain for 2 weeks. Blood in urine
last [REDACTED]. Right shoulder surgery last [REDACTED].

EXAM:
CT ABDOMEN AND PELVIS WITHOUT CONTRAST
TECHNIQUE: Multidetector CT imaging of the abdomen and pelvis was performed
following the standard protocol without IV contrast.

[Series 4: lung bases · axial · 0.68mm/px · z∈[-562,-446]mm · 6 of 33 slices shown, 11 images]
[im 5/33  soft-tissue]
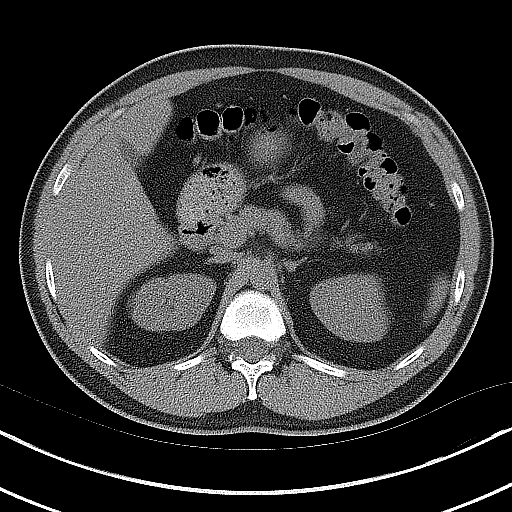
[im 5/33  bone]
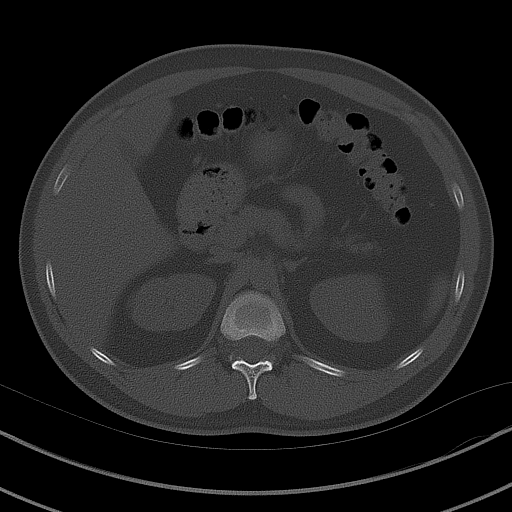
[im 10/33  soft-tissue]
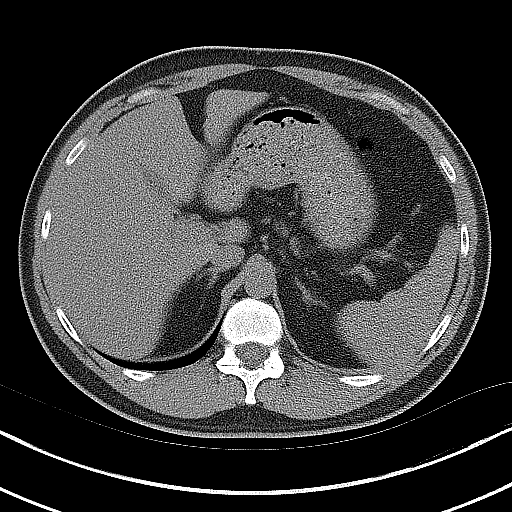
[im 14/33  soft-tissue]
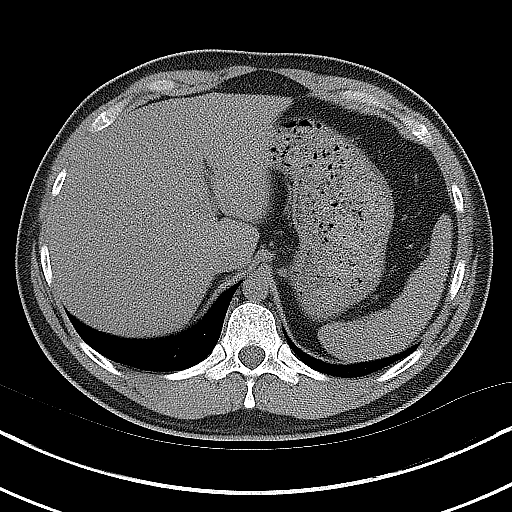
[im 14/33  lung]
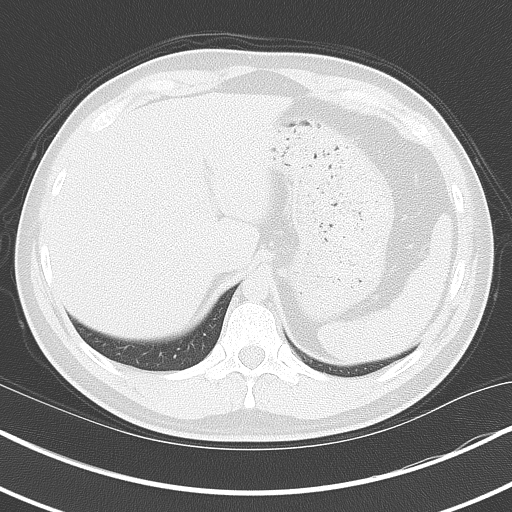
[im 19/33  soft-tissue]
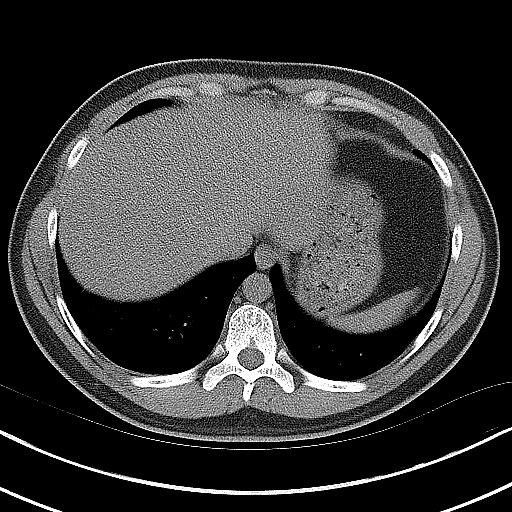
[im 19/33  lung]
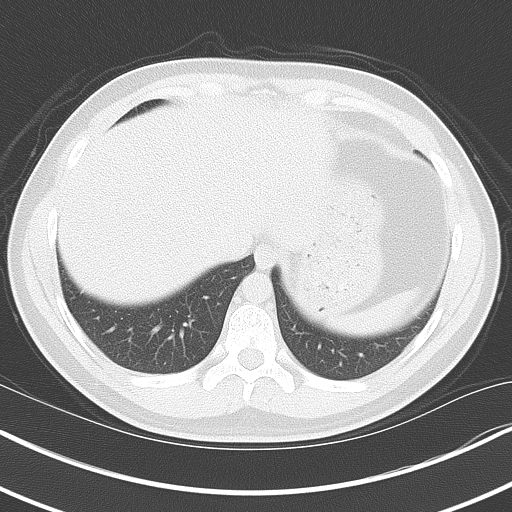
[im 23/33  soft-tissue]
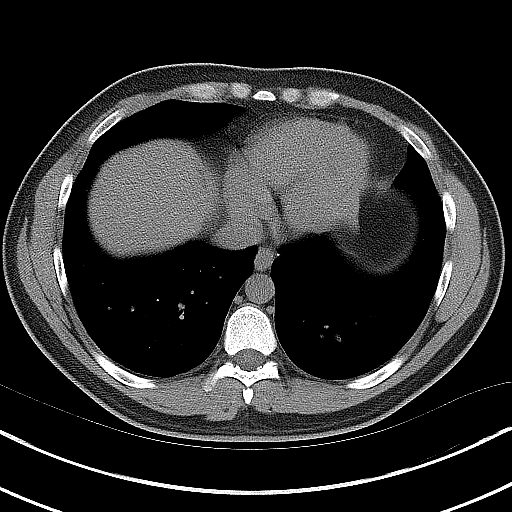
[im 23/33  lung]
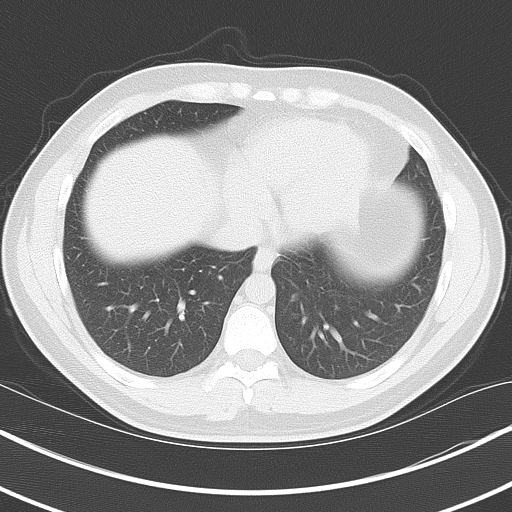
[im 28/33  soft-tissue]
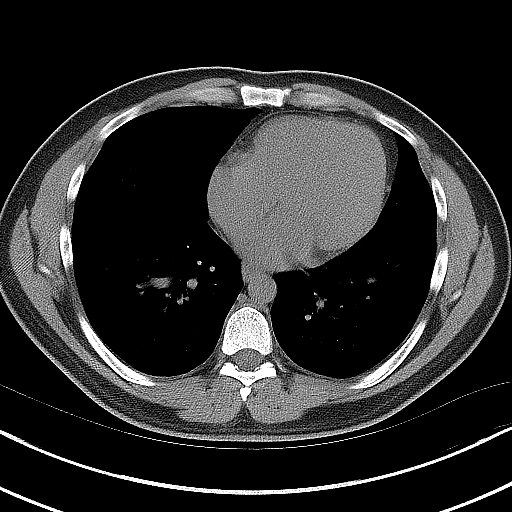
[im 28/33  lung]
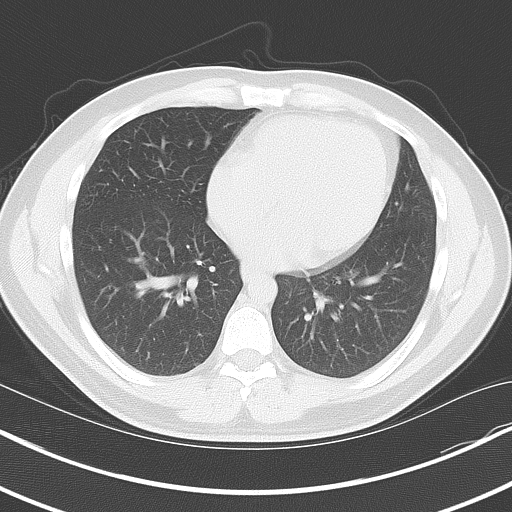

[Series 5: coronal · coronal · 0.89mm/px · 3 of 151 slices shown, 4 images]
[im 51/151  soft-tissue]
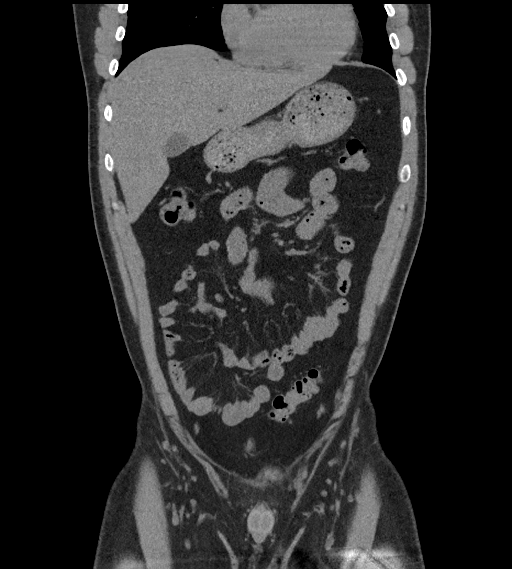
[im 67/151  soft-tissue]
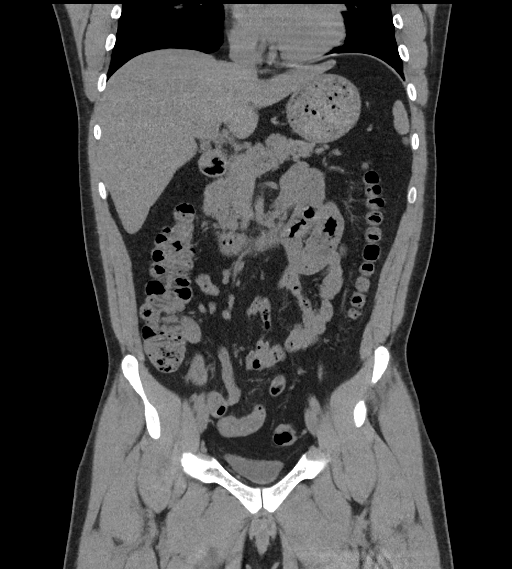
[im 67/151  bone]
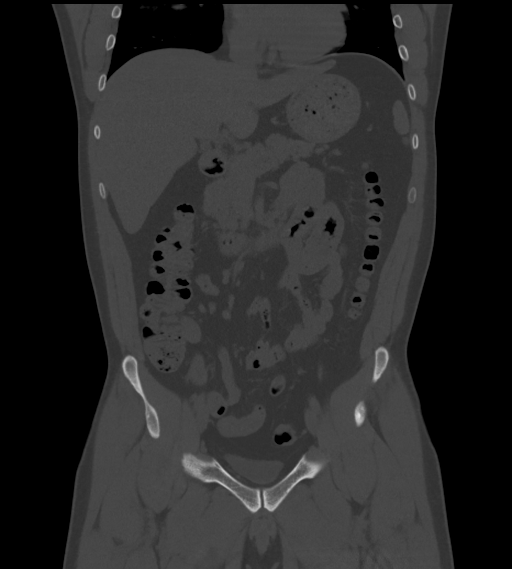
[im 84/151  soft-tissue]
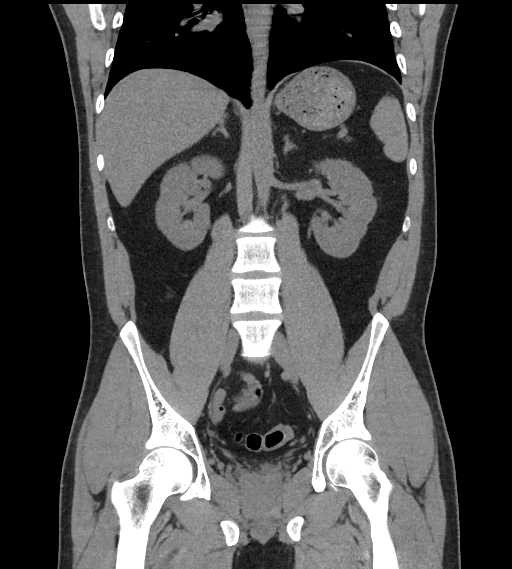

[Series 6: sagittal · sagittal · 0.63mm/px · 1 of 168 slices shown, 2 images]
[im 56/168  soft-tissue]
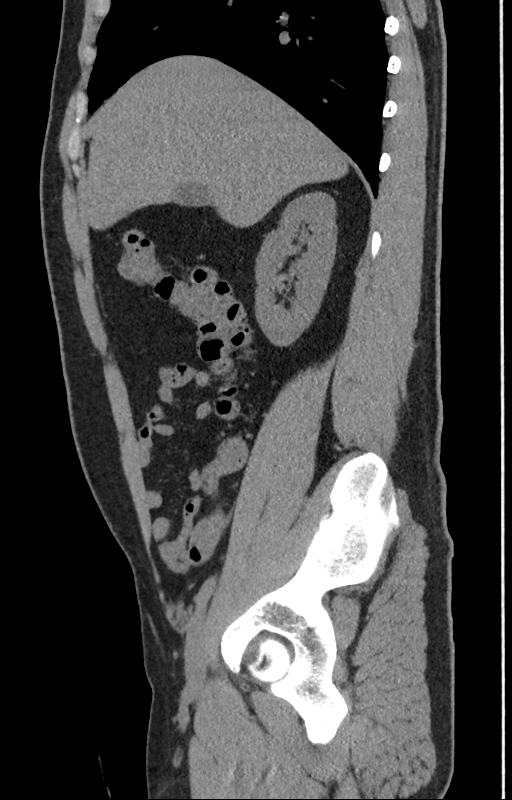
[im 56/168  bone]
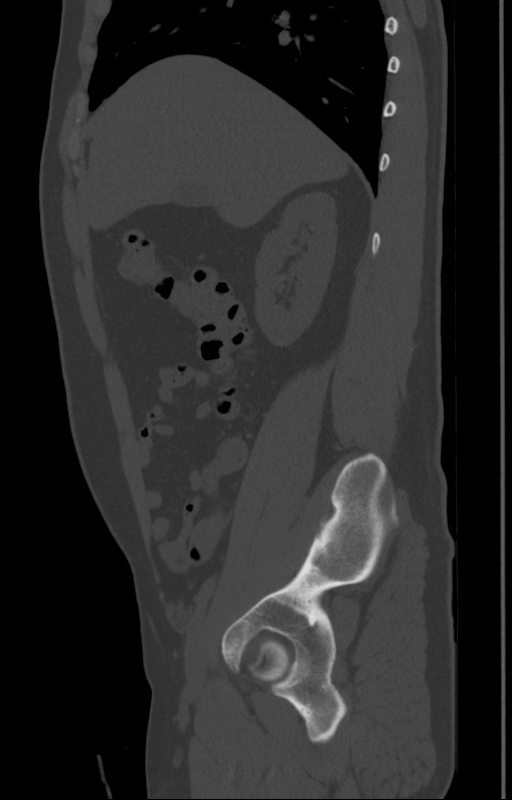

[10 of 46 positions shown; findings below may reference images not displayed]

FINDINGS: Lower chest: No acute abnormality.

Hepatobiliary: No focal liver abnormality is seen. No radiopaque
gallstones, biliary dilatation, or pericholecystic inflammatory
changes.

Pancreas: Unremarkable. No pancreatic ductal dilatation or
surrounding inflammatory changes.

Spleen: Normal in size without focal abnormality.

Adrenals/Urinary Tract: Adrenal glands are normal in appearance.
Kidneys are symmetric in size. No hydronephrosis. No intrarenal or
ureteral stones. Urinary bladder and visualized urethra are normal
in appearance.

Stomach/Bowel: The stomach and small bowel loops are normal in
appearance. The appendix is well seen and has a normal appearance.
There are scattered diverticula. No acute diverticulitis. Average
stool burden.

Vascular/Lymphatic: No significant vascular findings are present. No
enlarged abdominal or pelvic lymph nodes.

Reproductive: Prostate is unremarkable.

Other: No abdominal wall hernia or abnormality. No abdominopelvic
ascites.

Musculoskeletal: No acute or significant osseous findings.
IMPRESSION: Diverticulosis without acute diverticulitis.

1. Average stool burden.
2. Normal appendix.
3. No nephrolithiasis or urinary tract obstruction.

## 2020-05-24 ENCOUNTER — Encounter: Payer: Self-pay | Admitting: Family Medicine

## 2020-05-24 ENCOUNTER — Telehealth (INDEPENDENT_AMBULATORY_CARE_PROVIDER_SITE_OTHER): Payer: BC Managed Care – PPO | Admitting: Family Medicine

## 2020-05-24 ENCOUNTER — Other Ambulatory Visit: Payer: Self-pay

## 2020-05-24 VITALS — HR 77 | Temp 98.2°F

## 2020-05-24 DIAGNOSIS — U071 COVID-19: Secondary | ICD-10-CM

## 2020-05-24 MED ORDER — BENZONATATE 200 MG PO CAPS: 200.0000 mg | ORAL_CAPSULE | Freq: Two times a day (BID) | ORAL | 0 refills | Status: DC | PRN

## 2020-05-24 MED ORDER — ALBUTEROL SULFATE HFA 108 (90 BASE) MCG/ACT IN AERS: 2.0000 | INHALATION_SPRAY | RESPIRATORY_TRACT | 0 refills | Status: DC | PRN

## 2020-05-24 NOTE — Patient Instructions (Signed)
It was great to see you!  Our plans for today:  - See below for self-isolation guidelines. You may end your quarantine once you are 10 days from symptom onset and fever free for 24 hours without use of tylenol or ibuprofen.  - We are referring you for COVID treatment with either monoclonal antibody infusion or oral antiviral. Someone will be contacting you about scheduling this if you qualify. We are experiencing a Sport and exercise psychologist of this and the oral antivirals as well as a backlog of patients needing treatment so this may cause a delay. - I recommend getting vaccinated once you are healed from your current infection. If you get the antibody infusion, you must wait 3 months before vaccination.  - Certainly, if you are having difficulties breathing or unable to keep down fluids, go to the Emergency Department.   Take care and seek immediate care sooner if you develop any concerns.   Dr. Linwood Dibbles     Person Under Monitoring Name: Travis Wise  Location: [address] [place] [address]   Infection Prevention Recommendations for Individuals Confirmed to have, or Being Evaluated for, 2019 Novel Coronavirus (COVID-19) Infection Who Receive Care at Home  Individuals who are confirmed to have, or are being evaluated for, COVID-19 should follow the prevention steps below until a healthcare provider or local or state health department says they can return to normal activities.  Stay home except to get medical care You should restrict activities outside your home, except for getting medical care. Do not go to work, school, or public areas, and do not use public transportation or taxis.  Call ahead before visiting your doctor Before your medical appointment, call the healthcare provider and tell them that you have, or are being evaluated for, COVID-19 infection. This will help the healthcare provider's office take steps to keep other people from getting infected. Ask your healthcare  provider to call the local or state health department.  Monitor your symptoms Seek prompt medical attention if your illness is worsening (e.g., difficulty breathing). Before going to your medical appointment, call the healthcare provider and tell them that you have, or are being evaluated for, COVID-19 infection. Ask your healthcare provider to call the local or state health department.  Wear a facemask You should wear a facemask that covers your nose and mouth when you are in the same room with other people and when you visit a healthcare provider. People who live with or visit you should also wear a facemask while they are in the same room with you.  Separate yourself from other people in your home As much as possible, you should stay in a different room from other people in your home. Also, you should use a separate bathroom, if available.  Avoid sharing household items You should not share dishes, drinking glasses, cups, eating utensils, towels, bedding, or other items with other people in your home. After using these items, you should wash them thoroughly with soap and water.  Cover your coughs and sneezes Cover your mouth and nose with a tissue when you cough or sneeze, or you can cough or sneeze into your sleeve. Throw used tissues in a lined trash can, and immediately wash your hands with soap and water for at least 20 seconds or use an alcohol-based hand rub.  Wash your [employer] your hands often and thoroughly with soap and water for at least 20 seconds. You can use an alcohol-based hand sanitizer if soap and water are not  available and if your hands are not visibly dirty. Avoid touching your eyes, nose, and mouth with unwashed hands.   Prevention Steps for Caregivers and Household Members of Individuals Confirmed to have, or Being Evaluated for, COVID-19 Infection Being Cared for in the Home  If you live with, or provide care at home for, a person confirmed to have, or  being evaluated for, COVID-19 infection please follow these guidelines to prevent infection:  Follow healthcare provider's instructions Make sure that you understand and can help the patient follow any healthcare provider instructions for all care.  Provide for the patient's basic needs You should help the patient with basic needs in the home and provide support for getting groceries, prescriptions, and other personal needs.  Monitor the patient's symptoms If they are getting sicker, call his or her medical provider and tell them that the patient has, or is being evaluated for, COVID-19 infection. This will help the healthcare provider's office take steps to keep other people from getting infected. Ask the healthcare provider to call the local or state health department.  Limit the number of people who have contact with the patient  If possible, have only one caregiver for the patient.  Other household members should stay in another home or place of residence. If this is not possible, they should stay  in another room, or be separated from the patient as much as possible. Use a separate bathroom, if available.  Restrict visitors who do not have an essential need to be in the home.  Keep older adults, very young children, and other sick people away from the patient Keep older adults, very young children, and those who have compromised immune systems or chronic health conditions away from the patient. This includes people with chronic heart, lung, or kidney conditions, diabetes, and cancer.  Ensure good ventilation Make sure that shared spaces in the home have good air flow, such as from an air conditioner or an opened window, weather permitting.  Wash your hands often  Wash your hands often and thoroughly with soap and water for at least 20 seconds. You can use an alcohol based hand sanitizer if soap and water are not available and if your hands are not visibly dirty.  Avoid  touching your eyes, nose, and mouth with unwashed hands.  Use disposable paper towels to dry your hands. If not available, use dedicated cloth towels and replace them when they become wet.  Wear a facemask and gloves  Wear a disposable facemask at all times in the room and gloves when you touch or have contact with the patient's blood, body fluids, and/or secretions or excretions, such as sweat, saliva, sputum, nasal mucus, vomit, urine, or feces.  Ensure the mask fits over your nose and mouth tightly, and do not touch it during use.  Throw out disposable facemasks and gloves after using them. Do not reuse.  Wash your hands immediately after removing your facemask and gloves.  If your personal clothing becomes contaminated, carefully remove clothing and launder. Wash your hands after handling contaminated clothing.  Place all used disposable facemasks, gloves, and other waste in a lined container before disposing them with other household waste.  Remove gloves and wash your hands immediately after handling these items.  Do not share dishes, glasses, or other household items with the patient  Avoid sharing household items. You should not share dishes, drinking glasses, cups, eating utensils, towels, bedding, or other items with a patient who is confirmed to have,  or being evaluated for, COVID-19 infection.  After the person uses these items, you should wash them thoroughly with soap and water.  Wash laundry thoroughly  Immediately remove and wash clothes or bedding that have blood, body fluids, and/or secretions or excretions, such as sweat, saliva, sputum, nasal mucus, vomit, urine, or feces, on them.  Wear gloves when handling laundry from the patient.  Read and follow directions on labels of laundry or clothing items and detergent. In general, wash and dry with the warmest temperatures recommended on the label.  Clean all areas the individual has used often  Clean all touchable  surfaces, such as counters, tabletops, doorknobs, bathroom fixtures, toilets, phones, keyboards, tablets, and bedside tables, every day. Also, clean any surfaces that may have blood, body fluids, and/or secretions or excretions on them.  Wear gloves when cleaning surfaces the patient has come in contact with.  Use a diluted bleach solution (e.g., dilute bleach with 1 part bleach and 10 parts water) or a household disinfectant with a label that says EPA-registered for coronaviruses. To make a bleach solution at home, add 1 tablespoon of bleach to 1 quart (4 cups) of water. For a larger supply, add  cup of bleach to 1 gallon (16 cups) of water.  Read labels of cleaning products and follow recommendations provided on product labels. Labels contain instructions for safe and effective use of the cleaning product including precautions you should take when applying the product, such as wearing gloves or eye protection and making sure you have good ventilation during use of the product.  Remove gloves and wash hands immediately after cleaning.  Monitor yourself for signs and symptoms of illness Caregivers and household members are considered close contacts, should monitor their health, and will be asked to limit movement outside of the home to the extent possible. Follow the monitoring steps for close contacts listed on the symptom monitoring form.   ? If you have additional questions, contact your local health department or call the epidemiologist on call at [phone] (available 24/7). ? This guidance is subject to change. For the most up-to-date guidance from [facility], please refer to their website: [email or web address]

## 2020-05-24 NOTE — Progress Notes (Signed)
Virtual Visit via Video Note  I connected with Travis Wise on 05/24/20 at  2:00 PM EST by a video enabled telemedicine application and verified that I am speaking with the correct person using two identifiers.  Location: Patient: home Provider: [facility]   I discussed the limitations of evaluation and management by telemedicine and the availability of in person appointments. The patient expressed understanding and agreed to proceed.  History of Present Illness:  UPPER RESPIRATORY TRACT INFECTION - COVID+ 2/4 - cough, congestion, headache, chills 2/8 - symptom onset 2/3 with cough - unvaccinated - asthma, no inhalers at home. Last used albuterol few years ago.  Worst symptom: Fever: no Cough: yes, nonproductive Shortness of breath: no Wheezing: no Chest pain: occasional Chest tightness: occasional Chest congestion: yes Nasal congestion: yes Runny nose: no Sneezing: yes Sore throat: no Headache: yes Ear pain: no  Ear pressure: no  Eyes red/itching:no Eye drainage/crusting: no  Vomiting: no Fatigue: yes Sick contacts: yes, daughter and wife also with COVID Context: worse Recurrent sinusitis: no Relief with OTC cold/cough medications: hasn't tried  Treatments attempted: tylenol     Observations/Objective:  Patient had trouble connecting to video visit, entirety of visit conducted over the phone.  Speaks in full sentences, no respiratory distress. Congested sounding, frequent coughing.   Assessment and Plan:  COVID-19 With mild-mod sx. With h/o asthma and unvaccinated status, is a candidate for COVID treatment, referral placed. Rx sent for albuterol, tessalon. Reviewed OTC symptom relief, self-quarantine guidelines, and emergency precautions. Note provided for work.      I discussed the assessment and treatment plan with the patient. The patient was provided an opportunity to ask questions and all were answered. The patient agreed with the plan and demonstrated an  understanding of the instructions.   The patient was advised to call back or seek an in-person evaluation if the symptoms worsen or if the condition fails to improve as anticipated.  I provided 10 minutes of non-face-to-face time during this encounter.   Caro Laroche, DO

## 2020-05-25 ENCOUNTER — Telehealth: Payer: Self-pay | Admitting: *Deleted

## 2020-05-25 NOTE — Telephone Encounter (Signed)
Called to discuss with patient about COVID-19 symptoms and the use of one of the available treatments for those with mild to moderate Covid symptoms and at a high risk of hospitalization.  Pt appears to qualify for outpatient treatment due to co-morbid conditions and/or a member of an at-risk group in accordance with the FDA Emergency Use Authorization.    Symptom onset: 05/24/20 Vaccinated: No Booster? No Immunocompromised? No Qualifiers: Asthma  Patient currently having mild symptoms and declines any treatment.   Travis Wise

## 2020-11-20 ENCOUNTER — Telehealth (INDEPENDENT_AMBULATORY_CARE_PROVIDER_SITE_OTHER): Payer: BC Managed Care – PPO | Admitting: Nurse Practitioner

## 2020-11-20 ENCOUNTER — Encounter: Payer: Self-pay | Admitting: Nurse Practitioner

## 2020-11-20 DIAGNOSIS — S40869A Insect bite (nonvenomous) of unspecified upper arm, initial encounter: Secondary | ICD-10-CM | POA: Diagnosis not present

## 2020-11-20 DIAGNOSIS — W57XXXA Bitten or stung by nonvenomous insect and other nonvenomous arthropods, initial encounter: Secondary | ICD-10-CM

## 2020-11-20 MED ORDER — PREDNISONE 10 MG PO TABS: 10.0000 mg | ORAL_TABLET | Freq: Every day | ORAL | 0 refills | Status: DC

## 2020-11-20 MED ORDER — AMOXICILLIN 500 MG PO CAPS: 500.0000 mg | ORAL_CAPSULE | Freq: Two times a day (BID) | ORAL | 0 refills | Status: AC

## 2020-11-20 NOTE — Progress Notes (Signed)
There were no vitals taken for this visit.   Subjective:    Patient ID: Travis Wise, male    DOB: March 03, 1986, 35 y.o.   MRN: [id number]  HPI: Travis Wise is a 35 y.o. male  Chief Complaint  Patient presents with   Ticks    Patient states that he got into a bed of ticks last weekend and this weekend. Whole body was covered with ticks, has gotten most of them off, but still finding them.    INSECT BITE Patient states he got into a bed of ticks over the weekend and last weekend.  Whole body was covered with ticks but has gotten most of them off.   Relevant past medical, surgical, family and social history reviewed and updated as indicated. Interim medical history since our last visit reviewed. Allergies and medications reviewed and updated.  Review of Systems  Skin:        Tick bites all over body   Per HPI unless specifically indicated above     Objective:    There were no vitals taken for this visit.  Wt Readings from Last 3 Encounters:  08/28/18 168 lb (76.2 kg)  05/19/18 164 lb 6.4 oz (74.6 kg)  05/15/17 171 lb 12.8 oz (77.9 kg)    Physical Exam Vitals and nursing note reviewed.  Constitutional:      General: He is not in acute distress.    Appearance: He is not ill-appearing.  HENT:     Head: Normocephalic.     Right Ear: Hearing normal.     Left Ear: Hearing normal.     Nose: Nose normal.  Pulmonary:     Effort: Pulmonary effort is normal. No respiratory distress.  Neurological:     Mental Status: He is alert.  Psychiatric:        Mood and Affect: Mood normal.        Behavior: Behavior normal.        Thought Content: Thought content normal.        Judgment: Judgment normal.    Results for orders placed or performed in visit on 08/28/18  Rocky mtn spotted fvr abs pnl(IgG+IgM)  Result Value Ref Range   RMSF IgG Positive (A) Negative   RMSF IgM 0.48 0.00 - 0.89 index  Lyme Ab/Western Blot Reflex  Result Value Ref Range   Lyme IgG/IgM Ab <0.91  0.00 - 0.90 ISR   LYME DISEASE AB, QUANT, IGM <0.80 0.00 - 0.79 index  C-reactive protein  Result Value Ref Range   CRP <1 0 - 10 mg/L  Sed Rate (ESR)  Result Value Ref Range   Sed Rate 8 0 - 15 mm/hr  Rheumatoid Factor  Result Value Ref Range   Rhuematoid fact SerPl-aCnc <10.0 0.0 - 13.9 IU/mL  ANA w/Reflex  Result Value Ref Range   Anti Nuclear Antibody (ANA) Negative Negative  CBC with Differential/Platelet  Result Value Ref Range   WBC 6.5 3.4 - 10.8 x10E3/uL   RBC 5.47 4.14 - 5.80 x10E6/uL   Hemoglobin 15.5 13.0 - 17.7 g/dL   Hematocrit 48.2 37.5 - 51.0 %   MCV 88 79 - 97 fL   MCH 28.3 26.6 - 33.0 pg   MCHC 32.2 31.5 - 35.7 g/dL   RDW 13.5 11.6 - 15.4 %   Platelets 207 150 - 450 x10E3/uL   Neutrophils 51 Not Estab. %   Lymphs 36 Not Estab. %   Monocytes 9 Not Estab. %   Eos 2  Not Estab. %   Basos 1 Not Estab. %   Neutrophils Absolute 3.3 1.4 - 7.0 x10E3/uL   Lymphocytes Absolute 2.3 0.7 - 3.1 x10E3/uL   Monocytes Absolute 0.6 0.1 - 0.9 x10E3/uL   EOS (ABSOLUTE) 0.2 0.0 - 0.4 x10E3/uL   Basophils Absolute 0.1 0.0 - 0.2 x10E3/uL   Immature Granulocytes 1 Not Estab. %   Immature Grans (Abs) 0.1 0.0 - 0.1 x10E3/uL  RMSF, IgG, IFA  Result Value Ref Range   RMSF, IGG, IFA 1:256 (H) Neg <1:64      Assessment & Plan:   Problem List Items Addressed This Visit   None Visit Diagnoses     Insect bite, unspecified site, initial encounter    -  Primary   Prednisone given for symptom managment. Amoxicillin given to treat prophylactically due to the quantity of bites. Patient works outside and unable to take doxy.        Follow up plan: No follow-ups on file.   This visit was completed via MyChart due to the restrictions of the COVID-19 pandemic. All issues as above were discussed and addressed. Physical exam was done as above through visual confirmation on MyChart. If it was felt that the patient should be evaluated in the office, they were directed there. The patient  verbally consented to this visit. Location of the patient: Home Location of the provider: Office Those involved with this call:  Provider: Jon Billings, NP CMA: Tiffany Reel, CMA Front Desk/Registration: Roe Rutherford This encounter was conducted via video.  I spent 15 dedicated to the care of this patient on the date of this encounter to include previsit review of 20, face to face time with the patient, and post visit ordering of testing.

## 2020-12-17 ENCOUNTER — Other Ambulatory Visit: Payer: Self-pay | Admitting: Nurse Practitioner

## 2020-12-17 NOTE — Telephone Encounter (Signed)
Requested medication (s) are due for refill today: no  Requested medication (s) are on the active medication list: yes  Last refill:  09/21/20  Future visit scheduled: no  Notes to clinic: med not delegated to NT to RF   Requested Prescriptions  Pending Prescriptions Disp Refills   predniSONE (DELTASONE) 10 MG tablet [Pharmacy Med Name: PREDNISONE 10 MG TABLET] 30 tablet 1    Sig: Take 1 tablet (10 mg total) by mouth daily with breakfast.     Not Delegated - Endocrinology:  Oral Corticosteroids Failed - 12/17/2020  9:20 AM      Failed - This refill cannot be delegated      Passed - Last BP in normal range    BP Readings from Last 1 Encounters:  05/19/18 131/84          Passed - Valid encounter within last 6 months    Recent Outpatient Visits           3 weeks ago Insect bite, unspecified site, initial encounter   [facility] Larae Grooms, NP   6 months ago COVID-19   [facility] [place], Darl Householder, DO   2 years ago Swelling of both hands   [facility] Particia Nearing, [place]   2 years ago Body aches   [facility] Gabriel Cirri, NP   3 years ago Acute left flank pain   [facility] [place] Maser [place], [place]

## 2020-12-19 NOTE — Telephone Encounter (Signed)
Patient called in to inform [place] that he did not request a refill on this medication so please disregard

## 2020-12-19 NOTE — Telephone Encounter (Signed)
Called and LVM asking for patient to please return my call. Medication is typically not refilled.

## 2021-03-11 DIAGNOSIS — Z03818 Encounter for observation for suspected exposure to other biological agents ruled out: Secondary | ICD-10-CM | POA: Diagnosis not present

## 2021-03-11 DIAGNOSIS — J111 Influenza due to unidentified influenza virus with other respiratory manifestations: Secondary | ICD-10-CM | POA: Diagnosis not present

## 2021-03-11 DIAGNOSIS — Z20822 Contact with and (suspected) exposure to covid-19: Secondary | ICD-10-CM | POA: Diagnosis not present

## 2021-11-15 DIAGNOSIS — J029 Acute pharyngitis, unspecified: Secondary | ICD-10-CM | POA: Diagnosis not present

## 2021-11-15 DIAGNOSIS — R509 Fever, unspecified: Secondary | ICD-10-CM | POA: Diagnosis not present

## 2021-11-15 DIAGNOSIS — Z6826 Body mass index (BMI) 26.0-26.9, adult: Secondary | ICD-10-CM | POA: Diagnosis not present

## 2023-03-05 ENCOUNTER — Encounter: Payer: Self-pay | Admitting: Urology

## 2023-03-05 ENCOUNTER — Ambulatory Visit (INDEPENDENT_AMBULATORY_CARE_PROVIDER_SITE_OTHER): Payer: BC Managed Care – PPO | Admitting: Urology

## 2023-03-05 VITALS — BP 139/84 | HR 84 | Ht 68.0 in | Wt 160.0 lb

## 2023-03-05 DIAGNOSIS — Z3009 Encounter for other general counseling and advice on contraception: Secondary | ICD-10-CM | POA: Diagnosis not present

## 2023-03-05 NOTE — Progress Notes (Signed)
03/05/2023 9:30 AM   Reita Cliche 14-Mar-1986 [id number]  Referring provider: Larae Grooms, NP [address] [place],  [place] [address]  Chief Complaint  Patient presents with   VAS Consult    HPI: Travis Wise is a 37 y.o. male who presents for vasectomy counseling.  Married with 2 children Prior history of stone disease.  Denies chronic scrotal pain or history epididymitis/orchitis No previous history inguinal hernia or pelvic surgery No history of bleeding or clotting disorders   PMH: Past Medical History:  Diagnosis Date   Headache    MIGRAINES   History of kidney stones    Kidney stones     Surgical History: Past Surgical History:  Procedure Laterality Date   SHOULDER ARTHROSCOPY WITH LABRAL REPAIR Right 10/03/2016   Procedure: SHOULDER ARTHROSCOPY WITH LABRAL REPAIR;  Surgeon: Juanell Fairly, MD;  Location: [facility] ORS;  Service: Orthopedics;  Laterality: Right;   WISDOM TOOTH EXTRACTION      Home Medications:  Allergies as of 03/05/2023       Reactions   Imitrex [sumatriptan] Other (See Comments)   Made his shoulders and arms go numb.        Medication List        Accurate as of March 05, 2023  9:30 AM. If you have any questions, ask your nurse or doctor.          STOP taking these medications    predniSONE 10 MG tablet Commonly known as: DELTASONE Stopped by: Riki Altes        Allergies:  Allergies  Allergen Reactions   Imitrex [Sumatriptan] Other (See Comments)    Made his shoulders and arms go numb.    Family History: No family history on file.  Social History:  reports that he has never smoked. He has quit using smokeless tobacco. He reports current alcohol use. He reports that he does not use drugs.   Physical Exam: BP 139/84   Pulse 84   Ht 5\' 8"  (1.727 m)   Wt 160 lb (72.6 kg)   BMI 24.33 kg/m   Constitutional:  Alert and oriented, No acute distress. HEENT: [place] AT Respiratory: Normal respiratory  effort, no increased work of breathing. GI: Abdomen is soft, nontender, nondistended, no abdominal masses GU: Phallus without lesions, testes descended bilaterally without masses or tenderness, spermatic cord/epididymis palpably normal bilaterally.  Vasa palpable bilaterally Psychiatric: Normal mood and affect.   Assessment & Plan:    1.  Undesired fertility Desires to schedule vasectomy We had a long discussion about vasectomy. We specifically discussed the procedure, recovery and the risks, benefits and alternatives of vasectomy. I explained that the procedure entails removal of a segment of each vas deferens, each of which conducts sperm, and that the purpose of this procedure is to cause sterility (inability to produce children or cause pregnancy). Vasectomy is intended to be permanent and irreversible form of contraception. Options for fertility after vasectomy include vasectomy reversal, or sperm retrieval with in vitro fertilization. These options are not always successful, and they may be expensive. We discussed reversible forms of birth control such as condoms, IUD or diaphragms, as well as the option of freezing sperm in a sperm bank prior to the vasectomy procedure. We discussed the importance of avoiding strenuous exercise for four days after vasectomy, and the importance of refraining from any form of ejaculation for seven days after vasectomy. I explained that vasectomy does not produce immediate sterility so another form of contraceptive must be  used until sterility is assured by having semen checked for sperm. Thus, a post vasectomy semen analysis is necessary to confirm sterility. Rarely, vasectomy must be repeated. We discussed the approximately 1 in 2,000 risk of pregnancy after vasectomy for men who have post-vasectomy semen analysis showing absent sperm or rare non-motile sperm. Typical side effects include a small amount of oozing blood, some discomfort and mild swelling in the area  of incision.  Vasectomy does not affect sexual performance, function, please, sensation, interest, desire, satisfaction, penile erection, volume of semen or ejaculation. Other rare risks include allergy or adverse reaction to an anesthetic, testicular atrophy, hematoma, infection/abscess, prolonged tenderness of the vas deferens, pain, swelling, painful nodule or scar (called sperm granuloma) or epididymtis. We discussed chronic testicular pain syndrome. This has been reported to occur in as many as 1-2% of men and may be permanent. This can be treated with medication, small procedures or (rarely) surgery. He indicated he would call back if he desires a preprocedure anxiolytic and would need a driver if utilizing   Riki Altes, MD  [facility] [address], [address] [place], [place] [address] [phone]

## 2023-03-05 NOTE — Patient Instructions (Signed)
Pre-Vasectomy Instructions ? ?STOP all aspirin or blood thinners (Aspirin, Plavix, Coumadin, Warfarin, Motrin, Ibuprofen, Advil, Aleve, Naproxen, Naprosyn) for 7 days prior to the procedure.  If you have any questions about stopping these medications please contact your primary care physician or cardiologist. ? ?Shave all hair from the upper scrotum on the day of the procedure.  This means just under the penis onto the scrotal sac.  The area shaved should measure about 2-3 inches around.  You may lather the scrotum with soap and water, and shave with a safety razor. ? ?After shaving the area, thoroughly wash the penis and the scrotum, then shower or bathe to remove all the loose hairs.  If needed, wash the area again just before coming in for your Vasectomy. ? ?It is recommended to have a light meal an hour or so prior to the procedure. ? ?Bring a scrotal support (jock strap or suspensory, or tight jockey shorts or underwear).  Wear comfortable pants or shorts. ? ?While the actual procedure usually takes about 45 minutes, you should be prepared to stay in the office for approximately one hour.  Bring someone with you to drive you home. ? ?If you have any questions or concerns, please feel free to call the office at [phone]. ?  ?

## 2023-04-11 ENCOUNTER — Ambulatory Visit (INDEPENDENT_AMBULATORY_CARE_PROVIDER_SITE_OTHER): Payer: BC Managed Care – PPO | Admitting: Urology

## 2023-04-11 ENCOUNTER — Encounter: Payer: Self-pay | Admitting: Urology

## 2023-04-11 VITALS — BP 131/78 | HR 78

## 2023-04-11 DIAGNOSIS — Z302 Encounter for sterilization: Secondary | ICD-10-CM

## 2023-04-11 MED ORDER — HYDROCODONE-ACETAMINOPHEN 5-325 MG PO TABS
1.0000 | ORAL_TABLET | Freq: Four times a day (QID) | ORAL | 0 refills | Status: AC | PRN
Start: 2023-04-11 — End: ?

## 2023-04-11 NOTE — Progress Notes (Signed)
   Vasectomy Procedure Note  Indications: Travis Wise is a 37 y.o. male who presents today for elective sterilization.  He has been consented for the procedure.  He is aware of the risks and benefits.  He had no additional questions.  He agrees to proceed.  He denies any other significant change since his last visit.  Pre-operative Diagnosis: Elective sterilization  Post-operative Diagnosis: Elective sterilization  Premedication: N/A  Surgeon: Lorin Picket C. Darci Lykins, M.D  Description: The patient was prepped and draped in the standard fashion.  The right vas deferens was identified and brought superiorly to the anterior scrotal skin.  The skin and vas were then anesthetized utilizing 5 ml 1% lidocaine.  A small stab incision was made and spread with the vas dissector.  The vas was grasped utilizing the vas clamp and elevated out of the incision.   The vas was grasped utilizing the vas clamp and the vas sheath was incised.  The vas was dissected out of the sheath, elevated out of the incision and dissected free from the surrounding tissue and vessels.  A 1 cm segment was excised and the vas lumens were cauterized utilizing electrocautery.  No significant bleeding was observed.  The vas ends were then dropped back into the hemiscrotum.  The skin was closed with hemostatic pressure.  An identical procedure was performed on the contralateral side.  Clean dry gauze was applied to the incision sites.  The patient tolerated the procedure well.  Complications:None  Recommendations: 1.  No lifting greater than 10 pounds or strenuous activity for 1 week. 2.  Scrotal support for 1-2 weeks. 3.  May shower in 24 hours; no bath, hot tub for 1 week 4.  No intercourse for at least 7 days and resume based on level of discomfort  5.  Continue alternate contraception for 12 weeks.  6.  Call for significant pain, swelling, redness, drainage or fever greater than 100.5. 7.  Rx hydrocodone/APAP 5/325 1-2 every 6  hours prn pain. 8.  Follow-up semen analysis in 12 weeks.   Irineo Axon, MD

## 2023-04-11 NOTE — Patient Instructions (Signed)

## 2023-07-08 ENCOUNTER — Other Ambulatory Visit: Payer: Self-pay

## 2023-07-08 DIAGNOSIS — Z9852 Vasectomy status: Secondary | ICD-10-CM

## 2023-07-11 ENCOUNTER — Other Ambulatory Visit: Payer: BC Managed Care – PPO

## 2023-07-11 DIAGNOSIS — Z9852 Vasectomy status: Secondary | ICD-10-CM

## 2023-07-12 LAB — POST-VAS SPERM EVALUATION,QUAL: Volume: 1.6 mL

## 2023-07-13 ENCOUNTER — Encounter: Payer: Self-pay | Admitting: Urology
